# Patient Record
Sex: Female | Born: 1985
Health system: Southern US, Community
[De-identification: ages and names within clinical notes are randomized; demographics above are authoritative.]

## PROBLEM LIST (undated history)

## (undated) DIAGNOSIS — F419 Anxiety disorder, unspecified: Secondary | ICD-10-CM

## (undated) DIAGNOSIS — F329 Major depressive disorder, single episode, unspecified: Secondary | ICD-10-CM

## (undated) DIAGNOSIS — N880 Leukoplakia of cervix uteri: Secondary | ICD-10-CM

## (undated) DIAGNOSIS — F32A Depression, unspecified: Secondary | ICD-10-CM

## (undated) HISTORY — DX: Leukoplakia of cervix uteri: N88.0

## (undated) HISTORY — DX: Anxiety disorder, unspecified: F41.9

## (undated) HISTORY — PX: CHOLECYSTECTOMY: SHX55

## (undated) HISTORY — DX: Major depressive disorder, single episode, unspecified: F32.9

## (undated) HISTORY — DX: Depression, unspecified: F32.A

---

## 2015-05-07 DIAGNOSIS — Z9889 Other specified postprocedural states: Secondary | ICD-10-CM | POA: Insufficient documentation

## 2015-05-07 DIAGNOSIS — L905 Scar conditions and fibrosis of skin: Secondary | ICD-10-CM | POA: Insufficient documentation

## 2017-12-24 ENCOUNTER — Ambulatory Visit (INDEPENDENT_AMBULATORY_CARE_PROVIDER_SITE_OTHER): Payer: BLUE CROSS/BLUE SHIELD | Admitting: Physician Assistant

## 2017-12-24 ENCOUNTER — Encounter: Payer: Self-pay | Admitting: Physician Assistant

## 2017-12-24 ENCOUNTER — Ambulatory Visit (INDEPENDENT_AMBULATORY_CARE_PROVIDER_SITE_OTHER): Payer: BLUE CROSS/BLUE SHIELD | Admitting: Licensed Clinical Social Worker

## 2017-12-24 VITALS — BP 106/70 | HR 84 | Ht 64.0 in | Wt 144.0 lb

## 2017-12-24 DIAGNOSIS — F1221 Cannabis dependence, in remission: Secondary | ICD-10-CM | POA: Diagnosis not present

## 2017-12-24 DIAGNOSIS — F4310 Post-traumatic stress disorder, unspecified: Secondary | ICD-10-CM

## 2017-12-24 DIAGNOSIS — F331 Major depressive disorder, recurrent, moderate: Secondary | ICD-10-CM | POA: Diagnosis not present

## 2017-12-24 DIAGNOSIS — F410 Panic disorder [episodic paroxysmal anxiety] without agoraphobia: Secondary | ICD-10-CM | POA: Diagnosis not present

## 2017-12-24 DIAGNOSIS — F17290 Nicotine dependence, other tobacco product, uncomplicated: Secondary | ICD-10-CM | POA: Diagnosis not present

## 2017-12-24 DIAGNOSIS — Z7689 Persons encountering health services in other specified circumstances: Secondary | ICD-10-CM

## 2017-12-24 DIAGNOSIS — F418 Other specified anxiety disorders: Secondary | ICD-10-CM | POA: Diagnosis not present

## 2017-12-24 DIAGNOSIS — F411 Generalized anxiety disorder: Secondary | ICD-10-CM

## 2017-12-24 DIAGNOSIS — F341 Dysthymic disorder: Secondary | ICD-10-CM

## 2017-12-24 DIAGNOSIS — F1021 Alcohol dependence, in remission: Secondary | ICD-10-CM

## 2017-12-24 DIAGNOSIS — F17200 Nicotine dependence, unspecified, uncomplicated: Secondary | ICD-10-CM | POA: Insufficient documentation

## 2017-12-24 MED ORDER — ALPRAZOLAM 0.5 MG PO TABS: 0.5000 mg | ORAL_TABLET | Freq: Once | ORAL | 0 refills | Status: DC | PRN

## 2017-12-24 MED ORDER — ESCITALOPRAM OXALATE 5 MG PO TABS: ORAL_TABLET | ORAL | 0 refills | Status: DC
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
## 2017-12-24 NOTE — Progress Notes (Signed)
HPI:                                                                Paige Carter is a 32 y.o. female who [facility] today to establish care  Current concerns: anxiety and depression  Depression         This is a chronic problem.  The current episode started more than 1 year ago.   The onset quality is undetermined.   The problem occurs daily.  The problem has been gradually worsening since onset.  Associated symptoms include decreased concentration.  Associated symptoms include no suicidal ideas.     The symptoms are aggravated by work stress.  Past treatments include nothing.  Risk factors include alcohol intake, family history of mental illness, prior traumatic experience and stress.   Past medical history includes anxiety.     Pertinent negatives include no chronic illness, no recent psychiatric admission and no suicide attempts. Anxiety  Presents for initial visit. Onset was more than 5 years ago. The problem has been gradually worsening. Symptoms include decreased concentration, depressed mood, excessive worry, irritability, nervous/anxious behavior and panic. Patient reports no obsessions or suicidal ideas. Symptoms occur most days. The severity of symptoms is interfering with daily activities. The symptoms are aggravated by work stress.   Risk factors include family history and prior traumatic experience. There is no history of suicide attempts. Past treatments include nothing.     Reports years of uncontrolled anxiety and depression for many years. States she has been self-medicating with marijuana and alcohol. She has not had anything to drink in 3 weeks and has not used drugs in a week and a half. She was recently convicted of a DUI and has to pass bi-monthly drug tests. She states she is ready to learn coping skills that do not involve drugs or alcohol.  Denies history of self-harming behaviors or suicide attempt. She  does endorse very rare intrusive suicidal thoughts. Denies forming a plan or intention to act on these thoughts.   Depression screen PHQ 2/9 12/24/2017  Decreased Interest 1  Down, Depressed, Hopeless 1  PHQ - 2 Score 2  Altered sleeping 2  Tired, decreased energy 2  Change in appetite 2  Feeling bad or failure about yourself  2  Trouble concentrating 1  Moving slowly or fidgety/restless 1  Suicidal thoughts 1  PHQ-9 Score 13    GAD 7 : Generalized Anxiety Score 12/24/2017 12/24/2017  Nervous, Anxious, on Edge 3 3  Control/stop worrying 3 3  Worry too much - different things 3 3  Trouble relaxing 3 3  Restless 3 3  Easily annoyed or irritable 3 3  Afraid - awful might happen 1 1  Total GAD 7 Score 19 19      Past Medical History:  Diagnosis Date  . Anxiety   . Depression    Past Surgical History:  Procedure Laterality Date  . CHOLECYSTECTOMY     Social History   Tobacco Use  . Smoking status: Former Games developermoker  . Smokeless tobacco: Never Used  Substance Use Topics  . Alcohol use: Not Currently   family history includes Anxiety disorder in her mother and sister; Depression in her father; Heart attack  in her other; Hypertension in her other.    ROS: negative except as noted in the HPI  Medications: Current Outpatient Medications  Medication Sig Dispense Refill  . Probiotic Product (PROBIOTIC ADVANCED) CAPS Take by mouth.    . ALPRAZolam (XANAX) 0.5 MG tablet Take 1 tablet (0.5 mg total) by mouth once as needed for up to 1 dose for anxiety (panic attack). 20 tablet 0  . escitalopram (LEXAPRO) 5 MG tablet Take 0.5 tablets (2.5 mg total) by mouth at bedtime for 3 days, THEN 1 tablet (5 mg total) at bedtime for 4 days, THEN 2 tablets (10 mg total) at bedtime for 23 days. 52 tablet 0   No current facility-administered medications for this visit.    Not on File     Objective:  BP 106/70   Pulse 84   Ht 5\' 4"  (1.626 m)   Wt 144 lb (65.3 kg)   BMI 24.72 kg/m   Gen:  alert, not ill-appearing, no distress, appropriate for age HEENT: head normocephalic without obvious abnormality, conjunctiva and cornea clear, trachea midline Pulm: Normal work of breathing, normal phonation Neuro: alert and oriented x 3, no tremor MSK: extremities atraumatic, normal gait and station Skin: intact, no rashes on exposed skin, no jaundice, no cyanosis Psych: well-groomed, cooperative, good eye contact, depressed mood, affect mood-congruent, tearful, speech is articulate, and thought processes clear and goal-directed, good insight    No results found for this or any previous visit (from the past 72 hour(s)). No results found.    Assessment and Plan: 32 y.o. female with   Encounter to establish care  Other specified anxiety disorders - Plan: escitalopram (LEXAPRO) 5 MG tablet, Ambulatory referral to Psychiatry  Dysthymia - Plan: escitalopram (LEXAPRO) 5 MG tablet, Ambulatory referral to Psychiatry  Other tobacco product nicotine dependence, uncomplicated  PHQ9=13, no acute safety issues, GAD7=19 Starting Lexapro titration, 2.5 mg QHS x 3 days, 5 mg QHS x 4 days, 10 mg QHS. Counseled on adverse effects. Safety plan reviewed. She has used benzodiazepine prescription of friends for anxiety (not recreationally) in the past, but denies any history of abuse. Long discussion that we will do a low-dose and short supply to bridge her until her SSRI is at a therapeutic dose. Patient expressed understanding Referral placed to counseling Patient was also informed about the walk-in clinic on Wednesdays and encouraged to stop in today    Patient education and anticipatory guidance given Patient agrees with treatment plan Follow-up in 2 weeks or sooner as needed if symptoms worsen or fail to improve  Levonne Hubert PA-C

## 2017-12-24 NOTE — Patient Instructions (Signed)
Counseling: - [facility] all day - psychologytoday.com: search engine to locate local counselors - Family Services in your county offer counseling on a sliding scale (pay what you can afford) - [facility]: we can place a referral for you to see one of licensed counselors in [place], [place], or [place] - online counseling: BetterHelp and [facility] (not covered by insurance, but affordable self-pay rates)  Other resources: - [place].ateverydayhealth.com/depression/guide/resources/ - mindbodygreen.com - 7cupsoftea - https://malone.com/greatist.com/grow/resources-when-you-can-not-afford-therapy  Safety Plan: if having self-harm or suicidal thoughts Our Office [phone] [facility] Crisis Hotline 7577550043671 229 5516 National Suicide Hotline 1-800-SUICIDE If in immediate danger of harming yourself, go to the nearest emergency room or call 911

## 2018-01-07 ENCOUNTER — Encounter: Payer: Self-pay | Admitting: Physician Assistant

## 2018-01-07 ENCOUNTER — Ambulatory Visit (INDEPENDENT_AMBULATORY_CARE_PROVIDER_SITE_OTHER): Payer: BLUE CROSS/BLUE SHIELD | Admitting: Physician Assistant

## 2018-01-07 VITALS — BP 131/86 | HR 54 | Wt 142.0 lb

## 2018-01-07 DIAGNOSIS — Z87898 Personal history of other specified conditions: Secondary | ICD-10-CM | POA: Insufficient documentation

## 2018-01-07 DIAGNOSIS — F418 Other specified anxiety disorders: Secondary | ICD-10-CM | POA: Diagnosis not present

## 2018-01-07 DIAGNOSIS — F39 Unspecified mood [affective] disorder: Secondary | ICD-10-CM | POA: Diagnosis not present

## 2018-01-07 MED ORDER — ALPRAZOLAM 0.5 MG PO TABS: 0.5000 mg | ORAL_TABLET | Freq: Once | ORAL | 0 refills | Status: DC | PRN

## 2018-01-07 MED ORDER — LURASIDONE HCL 20 MG PO TABS: 20.0000 mg | ORAL_TABLET | Freq: Every day | ORAL | 1 refills | Status: DC

## 2018-01-07 MED ORDER — OLANZAPINE 5 MG PO TABS: 5.0000 mg | ORAL_TABLET | Freq: Every day | ORAL | 1 refills | Status: DC

## 2018-01-07 NOTE — Progress Notes (Signed)
HPI:                                                                Paige Carter is a 32 y.o. female who [facility] today for anxiety/depression follow-up  Pleasant 32 yo F presented 3 weeks ago with worsening anxiety, depression, and polysubstance misuse (marijuana and alcohol). She was started on Escitalopram self-titration up to 10 mg and low-dose Alprazolam 0.5 mg prn for panic attacks. Presents today for follow-up.  Reports when she takes Lexapro, "I feel like I'm on crack." She states she clenches her jaw, feels hyperactive/agitated and it takes her hours to wind down and fall asleep. She has tried dosing the medication earlier in the evening around 5 pm, but still has sleep difficulties. Reports it is taking hours to fall asleep, falls asleep atl 3am and usual wake time is 7a-8a, occasionally feels rested. She has been taking the Alprazolam most evenings due to the side effects of Lexapro. Has 4 tablets of Alprazolam left and has brought the bottle with her to this appointment.  Still feeling very irritable, states "I want to punch multiple people in the face." States the irritability makes her more anxious. Reports feeling less tearful and sad. Reports "I am not sad, but not happy." Reports she broke up with her boyfriend of 1 year and felt very emotionless about it and this is unusual for her.  She has not used any drugs or alcohol in over a month. History of DUI. Denies symptoms of mania. Mood disorder questionnaire is positive for hypomania.  [facility] 01/07/2018 12/24/2017  Decreased Interest 2 1  Down, Depressed, Hopeless 1 1  PHQ - 2 Score 3 2  Altered sleeping 3 2  Tired, decreased energy 3 2  Change in appetite 3 2  Feeling bad or failure about yourself  1 2  Trouble concentrating 1 1  Moving slowly or fidgety/restless 1 1  Suicidal thoughts 0 1  PHQ-9 Score 15 13    GAD 7 : Generalized  Anxiety Score 01/07/2018 12/24/2017 12/24/2017  Nervous, Anxious, on Edge 3 3 3   Control/stop worrying 1 3 3   Worry too much - different things 1 3 3   Trouble relaxing 2 3 3   Restless 1 3 3   Easily annoyed or irritable 3 3 3   Afraid - awful might happen 0 1 1  Total GAD 7 Score 11 19 19       Past Medical History:  Diagnosis Date  . Anxiety   . Depression    Past Surgical History:  Procedure Laterality Date  . CHOLECYSTECTOMY     Social History   Tobacco Use  . Smoking status: Former Games developermoker  . Smokeless tobacco: Never Used  Substance Use Topics  . Alcohol use: Not Currently   family history includes Anxiety disorder in her mother and sister; Depression in her father; Heart attack in her other; Hypertension in her other.    ROS: negative except as noted in the HPI  Medications: Current Outpatient Medications  Medication Sig Dispense Refill  . ALPRAZolam (XANAX) 0.5 MG tablet Take 1 tablet (0.5 mg total) by mouth once as needed for up to 1 dose for anxiety (panic attack). 20 tablet  0  . Probiotic Product (PROBIOTIC ADVANCED) CAPS Take by mouth.    . OLANZapine (ZYPREXA) 5 MG tablet Take 1 tablet (5 mg total) by mouth at bedtime. 30 tablet 1   No current facility-administered medications for this visit.    No Known Allergies     Objective:  BP 131/86   Pulse (!) 54   Wt 142 lb (64.4 kg)   BMI 24.37 kg/m  Gen:  alert, not ill-appearing, no distress, appropriate for age HEENT: head normocephalic without obvious abnormality, conjunctiva and cornea clear, trachea midline Pulm: Normal work of breathing, normal phonation, clear to auscultation bilaterally, no wheezes, rales or rhonchi CV: Normal rate, regular rhythm, s1 and s2 distinct, no murmurs, clicks or rubs  Neuro: alert and oriented x 3, no tremor MSK: extremities atraumatic, normal gait and station Skin: intact, no rashes on exposed skin, no jaundice, no cyanosis Psych: appearance casual, cooperative, good  eye contact, depressed mood, affect mood-congruent, speech is articulate, and thought processes clear and goal-directed, good insight    No results found for this or any previous visit (from the past 72 hour(s)). No results found.    Assessment and Plan: 32 y.o. female with   Mood disorder (HCC) - Plan: Ambulatory referral to Psychiatry, OLANZapine (ZYPREXA) 5 MG tablet, DISCONTINUED: lurasidone (LATUDA) 20 MG TABS tablet  Other specified anxiety disorders - Plan: ALPRAZolam (XANAX) 0.5 MG tablet, Ambulatory referral to Psychiatry  - I am concerned Lexapro is triggering hypomania. We are going to discontinue this. Her anxiety is responding to low-dose Alprazolam, but want to limit use due to her history of substance misuse - Mood Disorder Questionnaire positive today. Negative for hypersexuality, impulsive spending, decreased need for sleep - I am referring her to Psychiatry for evaluation for bipolar depression - No SI/HI, AH/VH, acute safety issues - discussed treatment option to include a mood stabilizer or await Psychiatry eval. She would like to start medication. Long discussion regarding risks/benefits, including increased suicidiality. Safety plan reviewed - start Olanazpine 5 mg QHS. Will continue Alprazolam 0.5 mg #20 until we can stabilize mood    Patient education and anticipatory guidance given Patient agrees with treatment plan Follow-up in 4 weeks or sooner as needed if symptoms worsen or fail to improve  Levonne Hubert PA-C

## 2018-01-07 NOTE — Patient Instructions (Addendum)
- stop Lexapro - start Latuda at bedtime - continue Xanax as needed for panic attacks - follow-up with Psychiatry   Safety Plan: if having self-harm or suicidal thoughts Our Office [phone] [facility] Crisis Hotline 50681330642177258177 National Suicide Hotline 1-800-SUICIDE If in immediate danger of harming yourself, go to the nearest emergency room or call 911      Living With Anxiety After being diagnosed with an anxiety disorder, you may be relieved to know why you have felt or behaved a certain way. It is natural to also feel overwhelmed about the treatment ahead and what it will mean for your life. With care and support, you can manage this condition and recover from it. How to cope with anxiety Dealing with stress Stress is your body's reaction to life changes and events, both good and bad. Stress can last just a few hours or it can be ongoing. Stress can play a major role in anxiety, so it is important to learn both how to cope with stress and how to think about it differently. Talk with your health care provider or a counselor to learn more about stress reduction. He or she may suggest some stress reduction techniques, such as:  Music therapy. This can include creating or listening to music that you enjoy and that inspires you.  Mindfulness-based meditation. This involves being aware of your normal breaths, rather than trying to control your breathing. It can be done while sitting or walking.  Centering prayer. This is a kind of meditation that involves focusing on a word, phrase, or sacred image that is meaningful to you and that brings you peace.  Deep breathing. To do this, expand your stomach and inhale slowly through your nose. Hold your breath for 3-5 seconds. Then exhale slowly, allowing your stomach muscles to relax.  Self-talk. This is a skill where you identify thought patterns that lead to anxiety reactions and correct those thoughts.  Muscle relaxation. This involves  tensing muscles then relaxing them.  Choose a stress reduction technique that fits your lifestyle and personality. Stress reduction techniques take time and practice. Set aside 5-15 minutes a day to do them. Therapists can offer training in these techniques. The training may be covered by some insurance plans. Other things you can do to manage stress include:  Keeping a stress diary. This can help you learn what triggers your stress and ways to control your response.  Thinking about how you respond to certain situations. You may not be able to control everything, but you can control your reaction.  Making time for activities that help you relax, and not feeling guilty about spending your time in this way.  Therapy combined with coping and stress-reduction skills provides the best chance for successful treatment. Medicines Medicines can help ease symptoms. Medicines for anxiety include:  Anti-anxiety drugs.  Antidepressants.  Beta-blockers.  Medicines may be used as the main treatment for anxiety disorder, along with therapy, or if other treatments are not working. Medicines should be prescribed by a health care provider. Relationships Relationships can play a big part in helping you recover. Try to spend more time connecting with trusted friends and family members. Consider going to couples counseling, taking family education classes, or going to family therapy. Therapy can help you and others better understand the condition. How to recognize changes in your condition Everyone has a different response to treatment for anxiety. Recovery from anxiety happens when symptoms decrease and stop interfering with your daily activities at home or work.  This may mean that you will start to:  Have better concentration and focus.  Sleep better.  Be less irritable.  Have more energy.  Have improved memory.  It is important to recognize when your condition is getting worse. Contact your health  care provider if your symptoms interfere with home or work and you do not feel like your condition is improving. Where to find help and support: You can get help and support from these sources:  Self-help groups.  Online and [employer].  A trusted spiritual leader.  Couples counseling.  Family education classes.  Family therapy.  Follow these instructions at home:  Eat a healthy diet that includes plenty of vegetables, fruits, whole grains, low-fat dairy products, and lean protein. Do not eat a lot of foods that are high in solid fats, added sugars, or salt.  Exercise. Most adults should do the following: ? Exercise for at least 150 minutes each week. The exercise should increase your heart rate and make you sweat (moderate-intensity exercise). ? Strengthening exercises at least twice a week.  Cut down on caffeine, tobacco, alcohol, and other potentially harmful substances.  Get the right amount and quality of sleep. Most adults need 7-9 hours of sleep each night.  Make choices that simplify your life.  Take over-the-counter and prescription medicines only as told by your health care provider.  Avoid caffeine, alcohol, and certain over-the-counter cold medicines. These may make you feel worse. Ask your pharmacist which medicines to avoid.  Keep all follow-up visits as told by your health care provider. This is important. Questions to ask your health care provider  Would I benefit from therapy?  How often should I follow up with a health care provider?  How long do I need to take medicine?  Are there any long-term side effects of my medicine?  Are there any alternatives to taking medicine? Contact a health care provider if:  You have a hard time staying focused or finishing daily tasks.  You spend many hours a day feeling worried about everyday life.  You become exhausted by worry.  You start to have headaches, feel tense, or have nausea.  You  urinate more than normal.  You have diarrhea. Get help right away if:  You have a racing heart and shortness of breath.  You have thoughts of hurting yourself or others. If you ever feel like you may hurt yourself or others, or have thoughts about taking your own life, get help right away. You can go to your nearest emergency department or call:  Your local emergency services (911 in the [place]  A suicide crisis helpline, such as the National Suicide Prevention Lifeline at 220 445 0810. This is open 24-hours a day.  Summary  Taking steps to deal with stress can help calm you.  Medicines cannot cure anxiety disorders, but they can help ease symptoms.  Family, friends, and partners can play a big part in helping you recover from an anxiety disorder. This information is not intended to replace advice given to you by your health care provider. Make sure you discuss any questions you have with your health care provider. Document Released: 05/28/2016 Document Revised: 05/28/2016 Document Reviewed: 05/28/2016 Elsevier Interactive Patient Education  [employer].

## 2018-01-14 ENCOUNTER — Telehealth: Payer: Self-pay | Admitting: Physician Assistant

## 2018-01-14 ENCOUNTER — Ambulatory Visit (INDEPENDENT_AMBULATORY_CARE_PROVIDER_SITE_OTHER): Payer: BLUE CROSS/BLUE SHIELD | Admitting: Physician Assistant

## 2018-01-14 ENCOUNTER — Encounter: Payer: Self-pay | Admitting: Physician Assistant

## 2018-01-14 ENCOUNTER — Other Ambulatory Visit (HOSPITAL_COMMUNITY)
Admission: RE | Admit: 2018-01-14 | Discharge: 2018-01-14 | Disposition: A | Discharge status: Home / Self Care | Payer: BLUE CROSS/BLUE SHIELD | Source: Ambulatory Visit | Attending: Physician Assistant | Admitting: Physician Assistant

## 2018-01-14 VITALS — BP 112/74 | HR 63 | Resp 16 | Wt 145.0 lb

## 2018-01-14 DIAGNOSIS — Z113 Encounter for screening for infections with a predominantly sexual mode of transmission: Secondary | ICD-10-CM

## 2018-01-14 DIAGNOSIS — Z124 Encounter for screening for malignant neoplasm of cervix: Secondary | ICD-10-CM | POA: Diagnosis present

## 2018-01-14 DIAGNOSIS — Z30011 Encounter for initial prescription of contraceptive pills: Secondary | ICD-10-CM | POA: Diagnosis not present

## 2018-01-14 LAB — POCT URINE PREGNANCY: Preg Test, Ur: NEGATIVE

## 2018-01-14 MED ORDER — NORETHIN-ETH ESTRAD-FE BIPHAS 1 MG-10 MCG / 10 MCG PO TABS: 1.0000 | ORAL_TABLET | Freq: Every day | ORAL | 4 refills | Status: DC

## 2018-01-14 NOTE — Progress Notes (Signed)
HPI:                                                                Paige Carter is a 32 y.o. female who presents to [facility] today for Pap smear only  Current Concerns include:   Would like to swtich OCP. Currently on COC, but did well on Loestrin in the past. She would like the lowest dose of estrogen possible.   GYN/Sexual Health  Obstetrics: G1P1  Menstrual status: having periods  LMP: 01/07/18  Menses: regular  Last pap smear: age 72  History of abnormal pap smears:   Sexually active: yes  Current contraception: OCP  History of STI: no  Declines screening  Health Maintenance Health Maintenance  Topic Date Due  . HIV Screening  06/28/1999  . PAP SMEAR  06/27/2005  . INFLUENZA VACCINE  01/15/2018  . TETANUS/TDAP  10/08/2024    Past Medical History:  Diagnosis Date  . Anxiety   . Depression    Past Surgical History:  Procedure Laterality Date  . CHOLECYSTECTOMY     Social History   Tobacco Use  . Smoking status: Former Games developer  . Smokeless tobacco: Never Used  Substance Use Topics  . Alcohol use: Not Currently   family history includes Anxiety disorder in her mother and sister; Depression in her father; Heart attack in her other; Hypertension in her other.  ROS: negative except as noted in the HPI  Medications: Current Outpatient Medications  Medication Sig Dispense Refill  . ALPRAZolam (XANAX) 0.5 MG tablet Take 1 tablet (0.5 mg total) by mouth once as needed for up to 1 dose for anxiety (panic attack). 20 tablet 0  . Norethindrone-Ethinyl Estradiol-Fe Biphas (LO LOESTRIN FE) 1 MG-10 MCG / 10 MCG tablet Take 1 tablet by mouth daily. 3 Package 4  . Probiotic Product (PROBIOTIC ADVANCED) CAPS Take by mouth.     No current facility-administered medications for this visit.    No Known Allergies     Objective:  BP 112/74   Pulse 63   Resp 16   Wt 145 lb (65.8 kg)   LMP 01/07/2018  (Approximate)   BMI 24.89 kg/m  GU: vulva without rashes or lesions, normal introitus and urethral meatus, vaginal mucosa without erythema, normal discharge, cervix non-friable with white flat lesions, unable to visualize the cervical os  A chaperone was used for the GU portion of the exam, Olivia Mackie, CMA.    Results for orders placed or performed in visit on 01/14/18 (from the past 72 hour(s))  POCT urine pregnancy     Status: Normal   Collection Time: 01/14/18 11:16 AM  Result Value Ref Range   Preg Test, Ur Negative Negative   No results found.    Assessment and Plan: 32 y.o. female with   Encounter for Pap smear of cervix with HPV DNA cotesting - Plan: Cytology - PAP  Encounter for BCP (birth control pills) initial prescription - Plan: Norethindrone-Ethinyl Estradiol-Fe Biphas (LO LOESTRIN FE) 1 MG-10 MCG / 10 MCG tablet, POCT urine pregnancy  Routine screening for STI (sexually transmitted infection) - Plan: C. trachomatis/N. gonorrhoeae RNA    Patient education and anticipatory guidance given Patient agrees with treatment plan Follow-up based on  Pap results or sooner as needed  Levonne Hubertharley E. Cayleen Benjamin PA-C

## 2018-01-14 NOTE — Telephone Encounter (Signed)
Received a message from pharmcy that Lo-Loestrin requires PA. Awaiting determination.

## 2018-01-14 NOTE — Patient Instructions (Signed)
Preventing Cervical Cancer Cervical cancer is cancer that grows on the cervix. The cervix is at the bottom of the uterus. It connects the uterus to the vagina. The uterus is where a baby develops during pregnancy. Cancer occurs when cells become abnormal and start to grow out of control. Cervical cancer grows slowly and may not cause any symptoms at first. Over time, the cancer can grow deep into the cervix tissue and spread to other areas. If it is found early, cervical cancer can be treated effectively. You can also take steps to prevent this type of cancer. Most cases of cervical cancer are caused by an STI (sexually transmitted infection) called human papillomavirus (HPV). One way to reduce your risk of cervical cancer is to avoid infection with the HPV virus. You can do this by practicing safe sex and by getting the HPV vaccine. Getting regular Pap tests is also important because this can help identify changes in cells that could lead to cancer. Your chances of getting this disease can also be reduced by making certain lifestyle changes. How can I protect myself from cervical cancer? Preventing HPV infection  Ask your health care provider about getting the HPV vaccine. If you are 26 years old or younger, you may need to get this vaccine, which is given in three doses over 6 months. This vaccine protects against the types of HPV that could cause cancer.  Limit the number of people you have sex with. Also avoid having sex with people who have had many sex partners.  Use a latex condom during sex. Getting Pap tests  Get Pap tests regularly, starting at age 21. Talk with your health care provider about how often you need these tests. ? Most women who are 21?33 years of age should have a Pap test every 3 years. ? Most women who are 30?33 years of age should have a Pap test in combination with an HPV test every 5 years. ? Women with a higher risk of cervical cancer, such as those with a weakened  immune system or those who have been exposed to the drug diethylstilbestrol (DES), may need more frequent testing. Making other lifestyle changes  Do not use any products that contain nicotine or tobacco, such as cigarettes and e-cigarettes. If you need help quitting, ask your health care provider.  Eat at least 5 servings of fruits and vegetables every day.  Lose weight if you are overweight. Why are these changes important?  These changes and screening tests are designed to address the factors that are known to increase the risk of cervical cancer. Taking these steps is the best way to reduce your risk.  Having regular Pap tests will help identify changes in cells that could lead to cancer. Steps can then be taken to prevent cancer from developing.  These changes will also help find cervical cancer early. This type of cancer can be treated effectively if it is found early. It can be more dangerous and difficult to treat if cancer has grown deep into your cervix or has spread.  In addition to making you less likely to get cervical cancer, these changes will also provide other health benefits, such as the following: ? Practicing safe sex is important for preventing STIs and unplanned pregnancies. ? Avoiding tobacco can reduce your risk for other cancers and health issues. ? Eating a healthy diet and maintaining a healthy weight are good for your overall health. What can happen if changes are not made? In the   early stages, cervical cancer might not have any symptoms. It can take many years for the cancer to grow and get deep into the cervix tissue. This may be happening without you knowing about it. If you develop any symptoms, such as pelvic pain or unusual discharge or bleeding from your vagina, you should see your health care provider right away. If cervical cancer is not found early, you might need treatments such as radiation, chemotherapy, or surgery. In some cases, surgery may mean that  you will not be able to get pregnant or carry a pregnancy to term. Where to find support: Talk with your health care provider, school nurse, or local health department for guidance about screening and vaccination. Some children and teens may be able to get the HPV vaccine free of charge through the U.[place] government's Vaccines for Children (VFC) program. Other places that provide vaccinations include:  Public health clinics. Check with your local health department.  Federally Qualified Health Centers, where you would pay only what you can afford. To find one near you, check this website: www.fqhc.org/find-an-fqhc/  Rural Health Clinics. These are part of a program for Medicare and Medicaid patients who live in rural areas.  The National Breast and Cervical Cancer Early Detection Program also provides breast and cervical cancer screenings and diagnostic services to low-income, uninsured, and underinsured women. Cervical cancer can be passed down through families. Talk with your health care provider or genetic counselor to learn more about genetic testing for cancer. Where to find more information: Learn more about cervical cancer from:  American College of Gynecology: www.acog.org/Patients/FAQs/Cervical-Cancer  American Cancer Society: www.cancer.org/cancer/cervicalcancer/  [place].S. Centers for Disease Control and Prevention: www.cdc.gov/cancer/cervical/  Summary  Talk with your health care provider about getting the HPV vaccine.  Be sure to get regular Pap tests as recommended by your health care provider.  See your health care provider right away if you have any pelvic pain or unusual discharge or bleeding from your vagina. This information is not intended to replace advice given to you by your health care provider. Make sure you discuss any questions you have with your health care provider. Document Released: 06/18/2015 Document Revised: 01/30/2016 Document Reviewed: 01/30/2016 Elsevier  Interactive Patient Education  2018 Elsevier Inc.  

## 2018-01-15 LAB — C. TRACHOMATIS/N. GONORRHOEAE RNA
C. trachomatis RNA, TMA: NOT DETECTED
N. gonorrhoeae RNA, TMA: NOT DETECTED

## 2018-01-16 ENCOUNTER — Telehealth: Payer: Self-pay | Admitting: Physician Assistant

## 2018-01-16 MED ORDER — QUETIAPINE FUMARATE 25 MG PO TABS: 25.0000 mg | ORAL_TABLET | Freq: Every day | ORAL | 0 refills | Status: DC

## 2018-01-16 MED ORDER — CLONAZEPAM 1 MG PO TABS
1.0000 mg | ORAL_TABLET | Freq: Two times a day (BID) | ORAL | 0 refills | Status: DC
Start: 2018-01-16 — End: 2018-02-12

## 2018-01-16 NOTE — Telephone Encounter (Signed)
Discussion with patient.  She is not doing quite well.  She is having to take Xanax frequently.  She did not tolerate olanzapine.  She is having trouble sleeping.  There is concern for bipolar disorder and hypomania.  Plan to switch to very low-dose Seroquel and titrate up from 25 mg up to potentially 100 mg over the next few days. Additionally switch to Klonopin.  I think the longer acting schedule Klonopin will work better than intermittent Xanax.  Ultimately I think it would be great if we can get Paulina in with psychiatry ASAP.  She is currently able to work but I am worried that she will lose her job or have major life problems as a result of the severity of her illness.  She had to leave work yesterday and I am quite concerned that her mental health is going to worsen and threaten her livelihood.  Additionally will contact [facility] and let them know that I am aware and intend for the patient to discontinue Xanax and start Klonopin.

## 2018-01-16 NOTE — Telephone Encounter (Signed)
[facility] that lo loestrin was approved from 01/14/2018 through 01/12/2021. Pharmacy notified and forms sent to scan.-HSM

## 2018-01-16 NOTE — Telephone Encounter (Signed)
Pt called clinic tearful today stating she cannot get into the Psych she was referred to until October. We are going to work on getting in her a different office sooner.   In the interim, Pt reports she is having to take the xanax more frequently because she is having about 3 panic attacks/day since she is not on a maintenance Rx. She was taken off Lexapro because it was making her very angry and irritable. She tried to get Latuda and Olanzapine but her insurance wouldn't cover them.   Questions if there is another maintenance Rx she can try. We found out that [facility] can get her scheduled in 2-3 weeks. Routing to covering Provider today for review/recommendation.

## 2018-01-16 NOTE — Telephone Encounter (Signed)
Left VM update with Pt of new referral location and their phone number for scheduling.

## 2018-01-16 NOTE — Telephone Encounter (Signed)
Sent MyChart message with contact info for referral as well.

## 2018-01-16 NOTE — Telephone Encounter (Signed)
Pharmacy advised  

## 2018-01-17 LAB — CYTOLOGY - PAP
Diagnosis: NEGATIVE
HPV: NOT DETECTED

## 2018-01-18 ENCOUNTER — Encounter: Payer: Self-pay | Admitting: Physician Assistant

## 2018-01-18 DIAGNOSIS — N880 Leukoplakia of cervix uteri: Secondary | ICD-10-CM

## 2018-01-18 HISTORY — DX: Leukoplakia of cervix uteri: N88.0

## 2018-01-19 ENCOUNTER — Ambulatory Visit (INDEPENDENT_AMBULATORY_CARE_PROVIDER_SITE_OTHER): Payer: BLUE CROSS/BLUE SHIELD | Admitting: Licensed Clinical Social Worker

## 2018-01-19 DIAGNOSIS — F1221 Cannabis dependence, in remission: Secondary | ICD-10-CM

## 2018-01-19 DIAGNOSIS — F410 Panic disorder [episodic paroxysmal anxiety] without agoraphobia: Secondary | ICD-10-CM

## 2018-01-19 DIAGNOSIS — F331 Major depressive disorder, recurrent, moderate: Secondary | ICD-10-CM | POA: Diagnosis not present

## 2018-01-19 DIAGNOSIS — F1021 Alcohol dependence, in remission: Secondary | ICD-10-CM

## 2018-01-19 DIAGNOSIS — F4310 Post-traumatic stress disorder, unspecified: Secondary | ICD-10-CM

## 2018-01-19 DIAGNOSIS — F411 Generalized anxiety disorder: Secondary | ICD-10-CM | POA: Diagnosis not present

## 2018-01-19 NOTE — Progress Notes (Addendum)
THERAPIST PROGRESS NOTE  Session Time: 10:02 AM to 10:58 AM  Participation Level: Active  Behavioral Response: CasualAlertDysphoric and tearful  Type of Therapy: Individual Therapy  Treatment Goals addressed: decrease in anxiety, coping for panic, decrease in depression, decrease in trauma symptoms, coping  Interventions: CBT, Solution Focused, Strength-based, Supportive and Other: trauma focused interventions, psychoeducation on d/a treatment and recorvery  Summary: Cataleah D Spieler is a 32 y.o. female who presents with panic disorder, generalized anxiety disorder, major depressive disorder, recurrent, moderate PTSD, cannabis use disorder, severe in early remission and alcohol use disorder, severe, in early remission  Suicidal/Homicidal: No  Therapist Response: Patient updated therapist about issues with medications, medications changes. Scheduled to go to [facility] to psychiatrist in two weeks. Believe that she has diagnosis of bipolar, BPD. Was put on Lexapro for two weeks, made aggressive, unhappy, miserable, emotionless, broke up with boyfriend who she has been happy with when on med. Stopped this medication, put on Zyprexa one day at night, still insomnia, couldn't walk straight, see straight until 5 PM, won't stay on the med because she has to function. Was having panic attacks more, 3 a day, was put on Xanax and continue to have panic although less severe, given 0.5 mg tabs and 20 pills in 2 weeks they were gone and this concerned her. Was put on Klonopin 1 mg in the morning and night and has really helped panic. Patient relates aware meds help in managing symptoms, but also has been using drugs and alcohol and suppressing things and needs to be able to develop coping strategies not to suppress, deal with things, and to deal separate from meds. Relates dad just went into the hospital on Saturday,attempted to use coping skills including deep breathing and distracting, didn't  take away the stress but took away physical pain, not hyperventilating. Therapist pointed out that applying the skills and being successful will help strengthen her skills. Patient relates, mind instantly goes into panic, different thoughts all at once. Therapist encouraged patient to slow reactivity time down that helps in making choices that helps choose more helpful coping skills. Discussed becoming more aware of automatic thoughts that are negative reframe to positive. Patient shares did not have panic and anxiety  until she was alone, suppresses thoughts until she was alone. Therapist provided education in explaining that increased anxiety and panic as overactive amygdala and that relaxation strategies will help to deactivate and decrease anxiety. Relates that listens to music when gets up, after work gets work clothes off and reads a book, at work no time to do relaxation, boss creates chaos, tries to decompress and relax after work but thinks about the day, can't stop thoughts, starts increasing panic, explains that everything she pushes down during the day, comes up, manage it but not easy. Reviewed with patient how reviewing the day turn into ruminating thoughts and not helpful problem solving, and distraction as helpful. Patient explains not just everyday life but things suppressing since 14. Has racing thoughts at night and Seroquel helps her to sleep, but can't wake up cloudy, was sitting up all night with thoughts going. Patient relates obsess because has to be perfect, if something goes wrong then obssss what she did wrong. Recognizes this relates to when young and couldn't do anything right, her attempts to be perfect so she doesn't ever have to hear that again. Her best discussed never doing anything right as a core belief, that needs to be challenged. Patient relates writing a journal  but curse words, yelling at herself and other people come out. Therapist discussed early recovery can be releasing  of emotions that have been suppressed, healthy to find ways to release emotions, recognizes a lot of anger being released. Recognizes anger because of childhood being basis of this, self-doubt, made to be a certain way but is carried to adult life. Being an obsessive person, every daily life would make a mistake, relates reviewing each time 1 makes a mistake, reliving when she made a mistake over something insignificant and stepdad pounding her head against the wall that she would learn not to make mistake, reviewed with mom says she doesn't remember that makes it worse, relates why she doesn't want to have feelings about this, patient expressed feeling that it's unfair, relives the moment every times she feels she has made a mistake, only she can get through it is pushing, figuratively kicking her way through the moment. Reviewed session and patient relates has to work on challenging perfectionism, having to work on trauma symptoms so she is not reliving the past and the present.  Therapist assessed patient current functioning per report. Provided psychoeducation on panic that involves both physiological symptoms of fighting fight as well as negative self talk, encouraged patient to attention to automatic thoughts and when she notices to reframe to positive one so she does not feed into spiraled panic. Discussed overall relaxation plan as approached to anxiety as it helps to calm body, helps overall level of anxiety someone is not as quickly activated as well as helping to learn relaxation skills and mom at that will help with escalation of anxiety. Identified relaxation strategies for patient. Discussed rumination asineffectual problem solving to recognize when she is engaging in this to help her refocus and not engaged in activity that is not helpful as well as distraction is helpful. Discussed reoccurring flashback and discuss cognitive strategy of reminding herself that memories from the past, that she's not  in the past that is not happening now to help deal with trigger. Discussed perfectionist standards that feed into ruminating thoughts, discussed challenging perfectionists thinking as a unrealistic thought, reframing and changing message to oneself that is more accurate and realistic such as a person with capabilities, a mix of skills and at a level of development, look at self from a growth perspective. Discussed challenging core belief developed in childhood that she is not good at anything, recognizing she defines worth and has value and needing to work strengthening this more accurate core belief about self. Provided strength based and supportive intervention.help patient with exploration and processing of emotions to help her also and gaining insight to early stage of recovery involves releasing of emotions that were suppressed and coming to learn about self that was loss and ones addiction  Plan: Return again in 2 weeks.2.therapist work with patient on challenging perfectionistic thinking, strategies to manage flashbacks, emotional regulation skills, begin work on processes of trauma. 3. Patient work on challenging perfectionists thinking, challenging ruminative thoughts which is ineffectual problem solving, beginning to use coping strategies for panic  Diagnosis: Axis I:  panic disorder, generalized anxiety disorder, major depressive disorder, recurrent, moderate PTSD, cannabis use disorder, severe, in early remission, alcohol use disorder, severe, in early remission     Axis II: no diagnosis    Coolidge BreezeMary Kelda Azad, LCSW 01/19/2018

## 2018-02-02 ENCOUNTER — Ambulatory Visit (HOSPITAL_COMMUNITY): Payer: BLUE CROSS/BLUE SHIELD | Admitting: Licensed Clinical Social Worker

## 2018-02-04 ENCOUNTER — Ambulatory Visit: Payer: Self-pay | Admitting: Physician Assistant

## 2018-02-10 ENCOUNTER — Ambulatory Visit: Payer: BLUE CROSS/BLUE SHIELD | Admitting: Physician Assistant

## 2018-02-12 ENCOUNTER — Ambulatory Visit (INDEPENDENT_AMBULATORY_CARE_PROVIDER_SITE_OTHER): Payer: BLUE CROSS/BLUE SHIELD | Admitting: Physician Assistant

## 2018-02-12 ENCOUNTER — Encounter: Payer: Self-pay | Admitting: Physician Assistant

## 2018-02-12 VITALS — BP 117/81 | HR 79 | Wt 145.0 lb

## 2018-02-12 DIAGNOSIS — F410 Panic disorder [episodic paroxysmal anxiety] without agoraphobia: Secondary | ICD-10-CM | POA: Diagnosis not present

## 2018-02-12 DIAGNOSIS — F39 Unspecified mood [affective] disorder: Secondary | ICD-10-CM | POA: Diagnosis not present

## 2018-02-12 MED ORDER — QUETIAPINE FUMARATE 25 MG PO TABS: 25.0000 mg | ORAL_TABLET | Freq: Every day | ORAL | 0 refills | Status: DC

## 2018-02-12 MED ORDER — CLONAZEPAM 0.5 MG PO TABS: 0.5000 mg | ORAL_TABLET | Freq: Two times a day (BID) | ORAL | 0 refills | Status: DC

## 2018-02-12 NOTE — Progress Notes (Signed)
HPI:                                                                Paige Carter is a 32 y.o. female who presents to [facility]: Primary Care Sports Medicine today for mood disorder follow-up  Since last office visit: - she was tapered off of Lexapro due to increased agitation and started on Zyprexa 5 mg. She was intolerant to the Zyprexa, "way too out of it and cloudy" and self-discontinued this after one dose - she had a positive mood disorder screening questionairre and was referred to Psychiatry. she has an appointment with a [facility] on 02/18/18 - she had one office visit with counselor downstairs Coolidge Breeze), but did not feel like she clicked with this counselor - she was switched to Quetiapine 25-50 mg at bedtime and Clonazepam 1 mg twice a day by provider on call Clementeen Graham, MD). She is currently taking 25 mg. This is working well for sleep. Sleeping 5-6 hours without nighttime awakenings. Recently has noticed that she is waking up prematurely in the morning, but is able to fall back asleep within 10-15 minutes -"Still have my episodes sometimes," which she describes as "overthinking, over-reacting which leads to panic, and I can't stop it once that happens." Would like to get through a day without having a panic attack.  Office visit 01/07/18 Pleasant 32 yo F presented 3 weeks ago with worsening anxiety, depression, and polysubstance misuse (marijuana and alcohol). She was started on Escitalopram self-titration up to 10 mg and low-dose Alprazolam 0.5 mg prn for panic attacks. Presents today for follow-up.  Reports when she takes Lexapro, "I feel like I'm on crack." She states she clenches her jaw, feels hyperactive/agitated and it takes her hours to wind down and fall asleep. She has tried dosing the medication earlier in the evening around 5 pm, but still has sleep difficulties. Reports it is taking hours to fall asleep, falls asleep atl  3am and usual wake time is 7a-8a, occasionally feels rested. She has been taking the Alprazolam most evenings due to the side effects of Lexapro. Has 4 tablets of Alprazolam left and has brought the bottle with her to this appointment.  Still feeling very irritable, states "I want to punch multiple people in the face." States the irritability makes her more anxious. Reports feeling less tearful and sad. Reports "I am not sad, but not happy." Reports she broke up with her boyfriend of 1 year and felt very emotionless about it and this is unusual for her.  She has not used any drugs or alcohol in over a month. History of DUI. Denies symptoms of mania. Mood disorder questionnaire is positive for hypomania. Depression screen [facility] 2/9 02/12/2018 01/07/2018 12/24/2017  Decreased Interest 2 2 1   Down, Depressed, Hopeless 2 1 1   PHQ - 2 Score 4 3 2   Altered sleeping 1 3 2   Tired, decreased energy 2 3 2   Change in appetite 2 3 2   Feeling bad or failure about yourself  3 1 2   Trouble concentrating 2 1 1   Moving slowly or fidgety/restless 1 1 1   Suicidal thoughts 1 0 1  PHQ-9 Score 16 15 13     GAD 7 : Generalized Anxiety Score  02/12/2018 01/07/2018 12/24/2017 12/24/2017  Nervous, Anxious, on Edge 3 3 3 3   Control/stop worrying 3 1 3 3   Worry too much - different things 3 1 3 3   Trouble relaxing 3 2 3 3   Restless 3 1 3 3   Easily annoyed or irritable 3 3 3 3   Afraid - awful might happen 0 0 1 1  Total GAD 7 Score 18 11 19 19       Past Medical History:  Diagnosis Date  . Anxiety   . Depression   . Hyperkeratosis of cervix 01/18/2018   Past Surgical History:  Procedure Laterality Date  . CHOLECYSTECTOMY     Social History   Tobacco Use  . Smoking status: Former Games developermoker  . Smokeless tobacco: Never Used  Substance Use Topics  . Alcohol use: Not Currently   family history includes Anxiety disorder in her mother and sister; Depression in her father; Heart attack in her other; Hypertension in  her other.    ROS: negative except as noted in the HPI  Medications: Current Outpatient Medications  Medication Sig Dispense Refill  . clonazePAM (KLONOPIN) 0.5 MG tablet Take 1 tablet (0.5 mg total) by mouth 2 (two) times daily. 20 tablet 0  . Norethindrone-Ethinyl Estradiol-Fe Biphas (LO LOESTRIN FE) 1 MG-10 MCG / 10 MCG tablet Take 1 tablet by mouth daily. 3 Package 4  . Probiotic Product (PROBIOTIC ADVANCED) CAPS Take by mouth.    . QUEtiapine (SEROQUEL) 25 MG tablet Take 1-2 tablets (25-50 mg total) by mouth at bedtime. 30 tablet 0   No current facility-administered medications for this visit.    No Known Allergies     Objective:  BP 117/81   Pulse 79   Wt 145 lb (65.8 kg)   BMI 24.89 kg/m  Gen:  alert, not ill-appearing, no distress, appropriate for age HEENT: head normocephalic without obvious abnormality, conjunctiva and cornea clear, trachea midline Pulm: Normal work of breathing, normal phonation, clear to auscultation bilaterally, no wheezes, rales or rhonchi CV: Normal rate, regular rhythm, s1 and s2 distinct, no murmurs, clicks or rubs  Neuro: alert and oriented x 3, no tremor MSK: extremities atraumatic, normal gait and station Skin: intact, no rashes on exposed skin, no jaundice, no cyanosis Psych: casual appearance, cooperative, good eye contact, depressed mood, affect mood-congruent, speech is articulate, and thought processes clear and goal-directed, good insight    No results found for this or any previous visit (from the past 72 hour(s)). No results found.    Assessment and Plan: 32 y.o. female with   .Diagnoses and all orders for this visit:  Mood disorder (HCC) -     QUEtiapine (SEROQUEL) 25 MG tablet; Take 1-2 tablets (25-50 mg total) by mouth at bedtime.  Panic disorder -     clonazePAM (KLONOPIN) 0.5 MG tablet; Take 1 tablet (0.5 mg total) by mouth 2 (two) times daily.   Plan for Psychiatry to evaluate and take over medication management  next month. In the meantime, I am refilling Seroquel and reducing Clonazepam to 0.5 mg bid. We discussed that Psychiatry may not wish to continue benzodiazepine, especially with history of substance use disorder, and that use of this medication is a bridge until she is on a mood stabilizer/anti-anxiety controller medication. Patient is not currently misusing prescription medication or drugs/alcohol. Patient verbalized understanding. Encouraged her to establish with one of the counselors at [facility] for CBT  Patient education and anticipatory guidance given Patient agrees with treatment plan Follow-up as  needed if symptoms worsen or fail to improve  Darlyne Russian PA-C

## 2018-04-06 ENCOUNTER — Ambulatory Visit (HOSPITAL_COMMUNITY): Payer: Self-pay | Admitting: Psychiatry

## 2019-01-09 ENCOUNTER — Encounter: Payer: Self-pay | Admitting: Emergency Medicine

## 2019-01-09 ENCOUNTER — Emergency Department
Admission: EM | Admit: 2019-01-09 | Discharge: 2019-01-09 | Disposition: A | Discharge status: Home / Self Care | Payer: BLUE CROSS/BLUE SHIELD | Source: Home / Self Care

## 2019-01-09 ENCOUNTER — Other Ambulatory Visit: Payer: Self-pay

## 2019-01-09 ENCOUNTER — Emergency Department (INDEPENDENT_AMBULATORY_CARE_PROVIDER_SITE_OTHER): Payer: BC Managed Care – PPO

## 2019-01-09 DIAGNOSIS — S52124A Nondisplaced fracture of head of right radius, initial encounter for closed fracture: Secondary | ICD-10-CM

## 2019-01-09 DIAGNOSIS — W19XXXA Unspecified fall, initial encounter: Secondary | ICD-10-CM

## 2019-01-09 DIAGNOSIS — S59901A Unspecified injury of right elbow, initial encounter: Secondary | ICD-10-CM | POA: Diagnosis not present

## 2019-01-09 DIAGNOSIS — M25511 Pain in right shoulder: Secondary | ICD-10-CM

## 2019-01-09 DIAGNOSIS — W010XXA Fall on same level from slipping, tripping and stumbling without subsequent striking against object, initial encounter: Secondary | ICD-10-CM

## 2019-01-09 NOTE — ED Triage Notes (Signed)
Patient fell last night at work, fell on her back, now having right arm/elbow pain.  Unable to move her arm.

## 2019-01-09 NOTE — ED Provider Notes (Signed)
[facility]    CSN: [id number] Arrival date & time: 01/09/19  1307     History   Chief Complaint Chief Complaint  Patient presents with  . Arm Pain    HPI Paige Carter is a 33 y.o. female.   HPI Paige Carter is a 33 y.o. 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 Pt states someone had mopped the flood but did not put a wet floor sign up. Pt does not want to file worker's comp.  Pt is a Production designer, theatre/television/filmmanager at [employer].  She applied ice last night. No pain medication today. Pt concerned about limited ROM of her elbow.  Denies hitting her head or LOC.  Pt does not recall how her arm landed in the fall as it occurred so fast.  Denies wrist or hand pain. No numbness in hand.  No prior hx of fracture or Right arm pain.  She is Right hand dominant.    Past Medical History:  Diagnosis Date  . Anxiety   . Depression   . Hyperkeratosis of cervix 01/18/2018    Patient Active Problem List   Diagnosis Date Noted  . Hyperkeratosis of cervix 01/18/2018  . Mood disorder (HCC) 01/07/2018  . History of substance use disorder 01/07/2018  . Other specified anxiety disorders 12/24/2017  . Dysthymia 12/24/2017  . Nicotine dependence 12/24/2017  . S/P tendon repair 05/07/2015  . Scar adherent 05/07/2015    Past Surgical History:  Procedure Laterality Date  . CHOLECYSTECTOMY      OB History    Gravida  1   Para  1   Term      Preterm      AB      Living        SAB      TAB      Ectopic      Multiple      Live Births               Home Medications    Prior to Admission medications   Not on File    Family History Family History  Problem Relation Age of Onset  . Anxiety disorder Mother   . Depression Father   . Anxiety disorder Sister   . Hypertension Other   . Heart attack Other     Social  History Social History   Tobacco Use  . Smoking status: Former Games developermoker  . Smokeless tobacco: Never Used  Substance Use Topics  . Alcohol use: Not Currently  . Drug use: Not Currently    Types: Marijuana     Allergies   Patient has no known allergies.   Review of Systems Review of Systems  Musculoskeletal: Positive for arthralgias, back pain (mild), joint swelling and myalgias. Negative for neck pain and neck stiffness.  Skin: Positive for color change. Negative for wound.  Neurological: Negative for dizziness, light-headedness and headaches.     Physical Exam Triage Vital Signs ED Triage Vitals [01/09/19 1424]  Enc Vitals Group     BP 122/78     Pulse Rate 81     Resp      Temp      Temp src      SpO2 97 %     Weight 152 lb 4 oz (69.1 kg)     Height  5\' 4"  (1.626 m)     Head Circumference      Peak Flow      Pain Score 7     Pain Loc      Pain Edu?      Excl. in GC?    No data found.  Updated Vital Signs BP 122/78 (BP Location: Left Arm)   Pulse 81   Ht 5\' 4"  (1.626 m)   Wt 152 lb 4 oz (69.1 kg)   LMP 01/02/2019   SpO2 97%   BMI 26.13 kg/m   Visual Acuity Right Eye Distance:   Left Eye Distance:   Bilateral Distance:    Right Eye Near:   Left Eye Near:    Bilateral Near:     Physical Exam Vitals signs and nursing note reviewed.  Constitutional:      Appearance: She is well-developed.  HENT:     Head: Normocephalic and atraumatic.  Neck:     Musculoskeletal: Normal range of motion and neck supple. No neck rigidity or muscular tenderness.  Cardiovascular:     Rate and Rhythm: Normal rate.     Pulses:          Radial pulses are 2+ on the right side.  Pulmonary:     Effort: Pulmonary effort is normal.  Musculoskeletal:        General: Swelling and tenderness present.     Comments: Right shoulder: Mild tenderness to posterior and anterior shoulder. No deformity or crepitus. Right elbow: diffuse tenderness, limited flexion and extension due to  pain. Right wrist and hand: non-tender, full ROM. 5/5 grip strength.   Skin:    General: Skin is warm and dry.     Capillary Refill: Capillary refill takes less than 2 seconds.     Comments: Right elbow: faint ecchymosis to lateral epicondyle. Skin in tact  Neurological:     Mental Status: She is alert and oriented to person, place, and time.  Psychiatric:        Behavior: Behavior normal.      UC Treatments / Results  Labs (all labs ordered are listed, but only abnormal results are displayed) Labs Reviewed - No data to display  EKG   Radiology Dg Shoulder Right  Result Date: 01/09/2019 CLINICAL DATA:  Fall last night. Right shoulder pain and decreased range of motion. Initial encounter. EXAM: RIGHT SHOULDER - 2+ VIEW COMPARISON:  None. FINDINGS: There is no evidence of fracture or dislocation. There is no evidence of arthropathy or other focal bone abnormality. Soft tissues are unremarkable. IMPRESSION: Negative. Electronically Signed   By: Danae OrleansJohn A Stahl M.D.   On: 01/09/2019 15:11   Dg Elbow Complete Right (3+view)  Result Date: 01/09/2019 CLINICAL DATA:  Fall last night. Right elbow pain. Initial encounter. EXAM: RIGHT ELBOW - COMPLETE 3+ VIEW COMPARISON:  None. FINDINGS: A large elbow joint effusion is seen. A nondisplaced fracture is seen involving radial head. No other fractures or bone lesions identified. IMPRESSION: Nondisplaced radial head fracture.  Large elbow joint effusion. Electronically Signed   By: Danae OrleansJohn A Stahl M.D.   On: 01/09/2019 15:13    Procedures Splint Application  Date/Time: 01/09/2019 3:58 PM Performed by: Lurene ShadowPhelps, Ilham Roughton O, PA-C Authorized by: Lurene ShadowPhelps, Marvia Troost O, PA-C   Consent:    Consent obtained:  Verbal   Consent given by:  Patient   Risks discussed:  Discoloration, numbness, pain and swelling   Alternatives discussed:  No treatment and delayed treatment Pre-procedure details:    Sensation:  Normal   Skin color:  Warm, dry, pink. elbow is bruised.  Procedure details:    Laterality:  Right   Location:  Elbow   Elbow:  R elbow   Strapping: no     Splint type:  Long arm (posterior)   Supplies:  Elastic bandage, cotton padding and Ortho-Glass Post-procedure details:    Pain:  Unchanged   Sensation:  Normal   Skin color:  Warm dry and pink hand   Patient tolerance of procedure:  Tolerated well, no immediate complications   (including critical care time)  Medications Ordered in UC Medications - No data to display  Initial Impression / Assessment and Plan / UC Course  I have reviewed the triage vital signs and the nursing notes.  Pertinent labs & imaging results that were available during my care of the patient were reviewed by me and considered in my medical decision making (see chart for details).     Discussed imagine with pt. Pt placed in splint as noted above Sling provided. AVS provided.  Final Clinical Impressions(s) / UC Diagnoses   Final diagnoses:  Elbow injury, right, initial encounter  Closed nondisplaced fracture of head of right radius, initial encounter  Fall from slip, trip, or stumble, initial encounter     Discharge Instructions      You may take Tylenol and Motrin as needed for pain and swelling. You may also apply a cool compress to your splint, just be sure not to get the splint wet.  Please call on Monday to schedule an appointment with Sports Medicine later this week for further evaluation and treatment of your elbow fracture.     ED Prescriptions    None     Controlled Substance Prescriptions [place] Controlled Substance Registry consulted? Not Applicable   Tyrell Antonio 01/09/19 1559

## 2019-01-09 NOTE — Discharge Instructions (Signed)
°  You may take Tylenol and Motrin as needed for pain and swelling. You may also apply a cool compress to your splint, just be sure not to get the splint wet.  Please call on Monday to schedule an appointment with Sports Medicine later this week for further evaluation and treatment of your elbow fracture.

## 2019-01-13 ENCOUNTER — Other Ambulatory Visit: Payer: Self-pay

## 2019-01-13 ENCOUNTER — Ambulatory Visit (INDEPENDENT_AMBULATORY_CARE_PROVIDER_SITE_OTHER): Payer: BC Managed Care – PPO | Admitting: Family Medicine

## 2019-01-13 ENCOUNTER — Encounter: Payer: Self-pay | Admitting: Family Medicine

## 2019-01-13 VITALS — BP 114/76 | HR 74 | Temp 97.6°F | Wt 160.0 lb

## 2019-01-13 DIAGNOSIS — S52124A Nondisplaced fracture of head of right radius, initial encounter for closed fracture: Secondary | ICD-10-CM | POA: Diagnosis not present

## 2019-01-13 NOTE — Progress Notes (Signed)
Paige Carter is a 33 y.o. female who presents to [facility] today for right radial head fracture.  Patient slipped and fell on July 24 seen in urgent care on July 25.  At that time x-ray showed a nondisplaced right radial head fracture and an elbow effusion.  She was treated with posterior splint and sling and advised to follow-up with me today.  In the interim she notes that she is doing quite well.  She notes significantly reduced pain.  She is been able to wean out of the splint already and feels more comfortable in the sling than she does in the splint.  She works as a Freight forwarder at [employer] and notes that she tried to not use the sling or the splint last night at work and had a little bit more swelling but overall is very happy with how things are going.  She denies any radiating pain weakness or numbness distally.  ROS:  As above  Exam:  BP 114/76   Pulse 74   Temp 97.6 F (36.4 C) (Oral)   Wt 160 lb (72.6 kg)   LMP 01/02/2019   BMI 27.46 kg/m  Wt Readings from Last 5 Encounters:  01/13/19 160 lb (72.6 kg)  01/09/19 152 lb 4 oz (69.1 kg)  02/12/18 145 lb (65.8 kg)  01/14/18 145 lb (65.8 kg)  01/07/18 142 lb (64.4 kg)   General: Well Developed, well nourished, and in no acute distress.  Neuro/Psych: Alert and oriented x3, extra-ocular muscles intact, able to move all 4 extremities, sensation grossly intact. Skin: Warm and dry, no rashes noted.  Respiratory: Not using accessory muscles, speaking in full sentences, trachea midline.  Cardiovascular: Pulses palpable, no extremity edema. Abdomen: Does not appear distended. MSK: Right elbow relatively normal-appearing with slight bruising and slight swelling but otherwise no significant deformity. Range of motion Extension limited by about 20 degrees.  Pronation limited by about 20 degrees.  Lateral elbow not particularly tender.  Pulses cap refill and sensation are intact distally.     Lab and Radiology Results No results found for this or any previous visit (from the past 72 hour(s)). Dg Shoulder Right  Result Date: 01/09/2019 CLINICAL DATA:  Fall last night. Right shoulder pain and decreased range of motion. Initial encounter. EXAM: RIGHT SHOULDER - 2+ VIEW COMPARISON:  None. FINDINGS: There is no evidence of fracture or dislocation. There is no evidence of arthropathy or other focal bone abnormality. Soft tissues are unremarkable. IMPRESSION: Negative. Electronically Signed   By: Marlaine Hind M.D.   On: 01/09/2019 15:11   Dg Elbow Complete Right (3+view)  Result Date: 01/09/2019 CLINICAL DATA:  Fall last night. Right elbow pain. Initial encounter. EXAM: RIGHT ELBOW - COMPLETE 3+ VIEW COMPARISON:  None. FINDINGS: A large elbow joint effusion is seen. A nondisplaced fracture is seen involving radial head. No other fractures or bone lesions identified. IMPRESSION: Nondisplaced radial head fracture.  Large elbow joint effusion. Electronically Signed   By: Marlaine Hind M.D.   On: 01/09/2019 15:13   I personally (independently) visualized and performed the interpretation of the images attached in this note.     Assessment and Plan: 33 y.o. female with right elbow radial head fracture.  Nondisplaced on x-ray a few days ago.  Clinically doing extremely well.  Okay to transition to sling now.  Use splint as needed if worsening pain.  Stressed the importance of reduced activity with her right arm.  Recheck in  1 week with fall as well will start liberalize activity.  We will get x-rays at next visit.   PDMP not reviewed this encounter. No orders of the defined types were placed in this encounter.  No orders of the defined types were placed in this encounter.   Historical information moved to improve visibility of documentation.  Past Medical History:  Diagnosis Date  . Anxiety   . Depression   . Hyperkeratosis of cervix 01/18/2018   Past Surgical History:  Procedure  Laterality Date  . CHOLECYSTECTOMY     Social History   Tobacco Use  . Smoking status: Former Games developermoker  . Smokeless tobacco: Never Used  Substance Use Topics  . Alcohol use: Not Currently   family history includes Anxiety disorder in her mother and sister; Depression in her father; Heart attack in an other family member; Hypertension in an other family member.  Medications: No current outpatient medications on file.   No current facility-administered medications for this visit.    No Known Allergies    Discussed warning signs or symptoms. Please see discharge instructions. Patient expresses understanding.

## 2019-01-13 NOTE — Patient Instructions (Signed)
Thank you for coming in today. Check back in 1 week  Ok to transition to just sling as guided by pain.  Use the splint if needed.  OK to use otc medicines for pain as needed.  Limit activity as you can.  We will do xray at next check.    Radial Head Fracture  A radial head fracture is a break in the smaller bone in your forearm (radius) near the end of the bone at the elbow joint. There are two bones in your forearm. The radius, or radial bone, is the bone on the side of your thumb. There are different types of radial head fractures. The type is determined by the amount of movement (displacement) of bones from their normal positions:  Type 1. This is a small fracture in which the bone pieces remain together (nondisplaced fracture).  Type 2. The fracture is moderate, and bone pieces are slightly displaced.  Type 3. There are multiple fractures and displaced bone pieces. What are the causes? This condition is usually caused by falling and landing on an outstretched arm. What increases the risk? You are more likely to develop this condition if you:  Are female.  Are 77-56 years old.  Have weak bones or osteoporosis. What are the signs or symptoms? Symptoms of this condition include:  Swelling of the elbow joint.  Pain and difficulty when moving the elbow joint. How is this diagnosed? This condition is diagnosed based on your medical history and a physical exam. You may have X-rays to confirm the type of fracture. How is this treated? Treatment for this condition includes resting, icing, and raising (elevating) the injured area above the level of your heart. You may be given medicines to help relieve pain. Treatment varies depending on the type of fracture you have.  For a type 1 fracture, you may be given a splint or sling to keep your arm and elbow from moving for several days.  For a type 2 fracture, you may be given a splint or sling to keep your arm and elbow from moving  for several days. If the displacement is more severe, you may need surgery.  For a type 3 fracture, you will usually need surgery to have bone pieces removed. The entire radial head may need to be removed if the damage is severe. Follow these instructions at home: Medicines  Take over-the-counter and prescription medicines only as told by your health care provider.  Ask your health care provider if the medicine prescribed to you: ? Requires you to avoid driving or using heavy machinery. ? Can cause constipation. You may need to take these actions to prevent or treat constipation:  Drink enough fluid to keep your urine pale yellow.  Take over-the-counter or prescription medicines.  Eat foods that are high in fiber, such as beans, whole grains, and fresh fruits and vegetables.  Limit foods that are high in fat and processed sugars, such as fried or sweet foods. If you have a splint or sling:  Wear the splint or sling as told by your health care provider. Remove it only as told by your health care provider.  Loosen it if your fingers tingle, become numb, or turn cold and blue.  Keep it clean and dry. If you have a splint:  Do not put pressure on any part of the splint until it is fully hardened. This may take several hours.  Check the skin around it every day. Tell your health care provider about  any concerns. Bathing  Do not take baths, swim, or use a hot tub until your health care provider approves. Ask your health care provider if you may take showers. You may only be allowed to take sponge baths.  If the splint or sling is not waterproof: ? Do not let it get wet. ? Cover it with a watertight covering when you take a bath or shower. Managing pain, stiffness, and swelling   If directed, put ice on the injured area. ? If you have a removable splint or sling, remove it as told by your health care provider. ? Put ice in a plastic bag. ? Place a towel between your skin and the  bag. ? Leave the ice on for 20 minutes, 2-3 times a day.  Move your fingers often to reduce stiffness and swelling.  Raise (elevate) the injured area above the level of your heart while you are sitting or lying down. Activity  Ask your health care provider when it is safe to drive if you have a splint or sling on your arm.  Do not lift anything that is heavier than 10 lb (4.5 kg), or the limit that you are told, until your health care provider says that it is safe.  Return to your normal activities as told by your health care provider. Ask your health care provider what activities are safe for you.  Do exercises as told by your health care provider or physical therapist. General instructions  Do not use any products that contain nicotine or tobacco, such as cigarettes, e-cigarettes, and chewing tobacco. These can delay bone healing. If you need help quitting, ask your health care provider.  Keep all follow-up visits as told by your health care provider. This is important. Contact a health care provider if:  You have problems with your splint.  You have pain or swelling that gets worse. Get help right away if:  You have severe pain.  You have fluid or a bad smell coming from your splint.  Your hand or fingers get cold or turn pale or blue.  You lose feeling in any part of your hand or arm. Summary  A radial head fracture is a break in the smaller bone in your forearm (radius) near the end of the bone at the elbow joint.  This condition usually happens because of an injury, such as falling on an outstretched arm.  This condition may be treated with rest, ice, elevation, pain medicines, a splint or sling, and surgery. Treatment will vary depending on the type of fracture you have. This information is not intended to replace advice given to you by your health care provider. Make sure you discuss any questions you have with your health care provider. Document Released: 03/25/2006  Document Revised: 06/11/2018 Document Reviewed: 06/11/2018 Elsevier Patient Education  2020 [employer]

## 2019-01-20 ENCOUNTER — Ambulatory Visit (INDEPENDENT_AMBULATORY_CARE_PROVIDER_SITE_OTHER): Payer: BC Managed Care – PPO | Admitting: Family Medicine

## 2019-01-20 ENCOUNTER — Encounter: Payer: Self-pay | Admitting: Family Medicine

## 2019-01-20 ENCOUNTER — Ambulatory Visit (INDEPENDENT_AMBULATORY_CARE_PROVIDER_SITE_OTHER): Payer: BC Managed Care – PPO

## 2019-01-20 ENCOUNTER — Other Ambulatory Visit: Payer: Self-pay

## 2019-01-20 VITALS — BP 121/77 | HR 55 | Temp 97.9°F | Wt 156.0 lb

## 2019-01-20 DIAGNOSIS — S52124A Nondisplaced fracture of head of right radius, initial encounter for closed fracture: Secondary | ICD-10-CM

## 2019-01-20 DIAGNOSIS — M25531 Pain in right wrist: Secondary | ICD-10-CM

## 2019-01-20 NOTE — Progress Notes (Signed)
Paige Carter is a 33 y.o. female who presents to [facility] today for right radial head fracture follow-up.  Patient was seen a week ago for right radial head fracture.  She had been seen in urgent care and diagnosed on July 25.  In the interim she notes that she has developed some wrist pain.  She cannot recall any wrist pain immediately after the accident but has developed it worse over the last few days.  She notes pain is primarily located in the radial wrist.  She also has some mild elbow pain as well at the radial head where she has her fracture.   ROS:  As above  Exam:  BP 121/77   Pulse (!) 55   Temp 97.9 F (36.6 C) (Oral)   Wt 156 lb (70.8 kg)   LMP 01/02/2019   BMI 26.78 kg/m  Wt Readings from Last 5 Encounters:  01/20/19 156 lb (70.8 kg)  01/13/19 160 lb (72.6 kg)  01/09/19 152 lb 4 oz (69.1 kg)  02/12/18 145 lb (65.8 kg)  01/14/18 145 lb (65.8 kg)   General: Well Developed, well nourished, and in no acute distress.  Neuro/Psych: Alert and oriented x3, extra-ocular muscles intact, able to move all 4 extremities, sensation grossly intact. Skin: Warm and dry, no rashes noted.  Respiratory: Not using accessory muscles, speaking in full sentences, trachea midline.  Cardiovascular: Pulses palpable, no extremity edema. Abdomen: Does not appear distended. MSK: Right elbow normal-appearing not particularly tender.  Slight decreased range of motion lacking full extension by about 5 degrees and lacking full supination by about 5 degrees otherwise normal. Right wrist normal-appearing not particular tender.  Normal motion.  Negative Finkelstein's test.    Lab and Radiology Results X-ray images right elbow and right wrist obtained today personally independently reviewed  Right elbow: Continued subtle fracture radial head.  No displacement.  Fracture healing is present.  Reduced effusion compared to previous x-ray.  Right  wrist: No obvious fracture per my read today.  No severe degenerative changes.  Await formal radiology over read.    Assessment and Plan: 33 y.o. female with right elbow radial head fracture.  Doing quite well.  Continue sling especially with activity.  Recheck in about 2 weeks.  Right wrist pain: Some pain near the radial wrist and anatomical snuffbox.  X-ray does not show obvious fracture per my read however radiology overread is still pending.  It is likely due to wrist overuse with sling use.  However subtle scaphoid fracture could be present.  Plan for thumb spica splint and recheck in 2 weeks.   PDMP not reviewed this encounter. Orders Placed This Encounter  Procedures  . DG Elbow Complete Right    Standing Status:   Future    Number of Occurrences:   1    Standing Expiration Date:   03/21/2020    Order Specific Question:   Reason for Exam (SYMPTOM  OR DIAGNOSIS REQUIRED)    Answer:   follow up fracute    Order Specific Question:   Is patient pregnant?    Answer:   No    Order Specific Question:   Preferred imaging location?    Answer:   [facility]    Order Specific Question:   Radiology Contrast Protocol - do NOT remove file path    Answer:   \\[email or web address]  . DG Wrist Complete Right    Standing Status:   Future    Number  of Occurrences:   1    Standing Expiration Date:   03/21/2020    Order Specific Question:   Reason for Exam (SYMPTOM  OR DIAGNOSIS REQUIRED)    Answer:   radial wrist pain. Fell radial head fx 1 week ago    Order Specific Question:   Is patient pregnant?    Answer:   No    Order Specific Question:   Preferred imaging location?    [facility]    Order Specific Question:   Radiology Contrast Protocol - do NOT remove file path    Answer:   \\[email or web address]s.pdf   No orders of the defined types were placed in this encounter.   Historical  information moved to improve visibility of documentation.  Past Medical History:  Diagnosis Date  . Anxiety   . Depression   . Hyperkeratosis of cervix 01/18/2018   Past Surgical History:  Procedure Laterality Date  . CHOLECYSTECTOMY     Social History   Tobacco Use  . Smoking status: Former Games developermoker  . Smokeless tobacco: Never Used  Substance Use Topics  . Alcohol use: Not Currently   family history includes Anxiety disorder in her mother and sister; Depression in her father; Heart attack in an other family member; Hypertension in an other family member.  Medications: No current outpatient medications on file.   No current facility-administered medications for this visit.    No Known Allergies    Discussed warning signs or symptoms. Please see discharge instructions. Patient expresses understanding.

## 2019-01-20 NOTE — Patient Instructions (Signed)
Thank you for coming in today. We will use a brace/splint for the wrist.  Recheck in person in about 2 weeks.  If all is well we will be done.

## 2019-02-03 ENCOUNTER — Ambulatory Visit: Payer: BC Managed Care – PPO | Admitting: Family Medicine

## 2019-02-03 NOTE — Progress Notes (Deleted)
   Paige Carter is a 33 y.o. female who presents to [facility] today for right radial head fracture follow-up.  Patient fell and was seen in urgent care on 25 July for nondisplaced radial head fracture.  She followed back up with me most recently on August 5 after some immobilization.  At that time her elbow was feeling a lot better was only a little bit sore.  She also had some wrist pain as well and proceeded with sling for elbow and although x-ray did not show any fracture proceeded with thumb spica splint.  On recheck today she notes that ***    ROS:  As above  Exam:  There were no vitals taken for this visit. Wt Readings from Last 5 Encounters:  01/20/19 156 lb (70.8 kg)  01/13/19 160 lb (72.6 kg)  01/09/19 152 lb 4 oz (69.1 kg)  02/12/18 145 lb (65.8 kg)  01/14/18 145 lb (65.8 kg)   General: Well Developed, well nourished, and in no acute distress.  Neuro/Psych: Alert and oriented x3, extra-ocular muscles intact, able to move all 4 extremities, sensation grossly intact. Skin: Warm and dry, no rashes noted.  Respiratory: Not using accessory muscles, speaking in full sentences, trachea midline.  Cardiovascular: Pulses palpable, no extremity edema. Abdomen: Does not appear distended. MSK: ***    Lab and Radiology Results No results found for this or any previous visit (from the past 72 hour(s)). No results found.     Assessment and Plan: 33 y.o. female with ***   PDMP not reviewed this encounter. No orders of the defined types were placed in this encounter.  No orders of the defined types were placed in this encounter.   Historical information moved to improve visibility of documentation.  Past Medical History:  Diagnosis Date  . Anxiety   . Depression   . Hyperkeratosis of cervix 01/18/2018   Past Surgical History:  Procedure Laterality Date  . CHOLECYSTECTOMY     Social History   Tobacco Use  . Smoking  status: Former Research scientist (life sciences)  . Smokeless tobacco: Never Used  Substance Use Topics  . Alcohol use: Not Currently   family history includes Anxiety disorder in her mother and sister; Depression in her father; Heart attack in an other family member; Hypertension in an other family member.  Medications: No current outpatient medications on file.   No current facility-administered medications for this visit.    No Known Allergies    Discussed warning signs or symptoms. Please see discharge instructions. Patient expresses understanding.

## 2019-05-01 ENCOUNTER — Other Ambulatory Visit: Payer: Self-pay

## 2019-05-01 ENCOUNTER — Emergency Department (INDEPENDENT_AMBULATORY_CARE_PROVIDER_SITE_OTHER)
Admission: EM | Admit: 2019-05-01 | Discharge: 2019-05-01 | Disposition: A | Discharge status: Home / Self Care | Payer: BC Managed Care – PPO | Source: Home / Self Care

## 2019-05-01 ENCOUNTER — Encounter: Payer: Self-pay | Admitting: Emergency Medicine

## 2019-05-01 DIAGNOSIS — R05 Cough: Secondary | ICD-10-CM

## 2019-05-01 DIAGNOSIS — Z20828 Contact with and (suspected) exposure to other viral communicable diseases: Secondary | ICD-10-CM

## 2019-05-01 DIAGNOSIS — R059 Cough, unspecified: Secondary | ICD-10-CM

## 2019-05-01 DIAGNOSIS — Z20822 Contact with and (suspected) exposure to covid-19: Secondary | ICD-10-CM

## 2019-05-01 LAB — POC SARS CORONAVIRUS 2 AG -  ED: SARS Coronavirus 2 Ag: NEGATIVE

## 2019-05-01 NOTE — ED Triage Notes (Signed)
Here for COVID testing after mom tested positive on Thursday. Sx's started 3 days ago- slight cough, fatigue.

## 2019-05-01 NOTE — Discharge Instructions (Signed)
  You may take 500mg acetaminophen every 4-6 hours or in combination with ibuprofen 400-600mg every 6-8 hours as needed for pain, inflammation, and fever.  Be sure to well hydrated with clear liquids and get at least 8 hours of sleep at night, preferably more while sick.   Please follow up with family medicine in 1 week if needed.   

## 2019-05-01 NOTE — ED Provider Notes (Signed)
[facility]    CSN: [id number] Arrival date & time: 05/01/19  1104      History   Chief Complaint Chief Complaint  Patient presents with  . Cough    positive covid exposure 6 dys ago    HPI Claudean D Cato is a 33 y.o. female.   HPI Paige Carter is a 33 y.o. female presenting to [facility] with c/o 3 days of mild cough and fatigue. She reports her mother tested positive for Covid 2 days ago.  Pt was last around her mother about 1 week ago. Pt thinks her cough is due to allergies, but because she had close exposure to her mother last week, she wants to make sure she does not have Covid too. Denies fever, chills, n/v/d. No chest pain or SOB. She takes benadryl as needed for her allergies and cough.   Past Medical History:  Diagnosis Date  . Anxiety   . Depression   . Hyperkeratosis of cervix 01/18/2018    Patient Active Problem List   Diagnosis Date Noted  . Hyperkeratosis of cervix 01/18/2018  . Mood disorder (HCC) 01/07/2018  . History of substance use disorder 01/07/2018  . Other specified anxiety disorders 12/24/2017  . Dysthymia 12/24/2017  . Nicotine dependence 12/24/2017  . S/P tendon repair 05/07/2015  . Scar adherent 05/07/2015    Past Surgical History:  Procedure Laterality Date  . CHOLECYSTECTOMY      OB History    Gravida  1   Para  1   Term      Preterm      AB      Living        SAB      TAB      Ectopic      Multiple      Live Births               Home Medications    Prior to Admission medications   Not on File    Family History Family History  Problem Relation Age of Onset  . Anxiety disorder Mother   . Depression Father   . Anxiety disorder Sister   . Hypertension Other   . Heart attack Other     Social History Social History   Tobacco Use  . Smoking status: Former Games developer  . Smokeless tobacco: Never Used  Substance Use Topics  . Alcohol use: Not Currently  . Drug use: Not Currently    Types:  Marijuana     Allergies   Patient has no known allergies.   Review of Systems Review of Systems  Constitutional: Negative for chills and fever.  HENT: Positive for congestion (minimal). Negative for ear pain, sore throat, trouble swallowing and voice change.   Respiratory: Positive for cough. Negative for shortness of breath.   Cardiovascular: Negative for chest pain and palpitations.  Gastrointestinal: Negative for abdominal pain, diarrhea, nausea and vomiting.  Musculoskeletal: Negative for arthralgias, back pain and myalgias.  Skin: Negative for rash.     Physical Exam Triage Vital Signs ED Triage Vitals  Enc Vitals Group     BP 05/01/19 1152 125/81     Pulse Rate 05/01/19 1152 (!) 103     Resp --      Temp 05/01/19 1152 97.7 F (36.5 C)     Temp Source 05/01/19 1152 Oral     SpO2 05/01/19 1152 97 %     Weight 05/01/19 1153 160 lb (72.6 kg)  Height 05/01/19 1153 5\' 3"  (1.6 m)     Head Circumference --      Peak Flow --      Pain Score 05/01/19 1153 0     Pain Loc --      Pain Edu? --      Excl. in [place]? --    No data found.  Updated Vital Signs BP 125/81 (BP Location: Left Arm)   Pulse (!) 103   Temp 97.7 F (36.5 C) (Oral)   Ht 5\' 3"  (1.6 m)   Wt 160 lb (72.6 kg)   SpO2 97%   BMI 28.34 kg/m   Visual Acuity Right Eye Distance:   Left Eye Distance:   Bilateral Distance:    Right Eye Near:   Left Eye Near:    Bilateral Near:     Physical Exam Vitals signs and nursing note reviewed.  Constitutional:      General: She is not in acute distress.    Appearance: Normal appearance. She is well-developed. She is not ill-appearing, toxic-appearing or diaphoretic.  HENT:     Head: Normocephalic and atraumatic.     Right Ear: Tympanic membrane and ear canal normal.     Left Ear: Tympanic membrane and ear canal normal.     Nose: Nose normal.     Mouth/Throat:     Mouth: Mucous membranes are moist.     Pharynx: Oropharynx is clear.  Neck:      Musculoskeletal: Normal range of motion.  Cardiovascular:     Rate and Rhythm: Normal rate and regular rhythm.  Pulmonary:     Effort: Pulmonary effort is normal. No respiratory distress.     Breath sounds: Normal breath sounds. No stridor. No wheezing, rhonchi or rales.  Musculoskeletal: Normal range of motion.  Skin:    General: Skin is warm and dry.  Neurological:     Mental Status: She is alert and oriented to person, place, and time.  Psychiatric:        Behavior: Behavior normal.      UC Treatments / Results  Labs (all labs ordered are listed, but only abnormal results are displayed) Labs Reviewed  SARS-COV-2 RNA,(COVID-19) QUALITATIVE NAAT  POC SARS CORONAVIRUS 2 ED    EKG   Radiology No results found.  Procedures Procedures (including critical care time)  Medications Ordered in UC Medications - No data to display  Initial Impression / Assessment and Plan / UC Course  I have reviewed the triage vital signs and the nursing notes.  Pertinent labs & imaging results that were available during my care of the patient were reviewed by me and considered in my medical decision making (see chart for details).     Normal Exam Rapid Covid: NEGATIVE Covid send out pending AVS provided  Final Clinical Impressions(s) / UC Diagnoses   Final diagnoses:  Cough  Close exposure to COVID-19 virus     Discharge Instructions      You may take 500mg  acetaminophen every 4-6 hours or in combination with ibuprofen 400-600mg  every 6-8 hours as needed for pain, inflammation, and fever.  Be sure to well hydrated with clear liquids and get at least 8 hours of sleep at night, preferably more while sick.   Please follow up with family medicine in 1 week if needed.      ED Prescriptions    None     PDMP not reviewed this encounter.   Noe Gens, PA-C 05/01/19 1224

## 2019-05-03 LAB — SARS-COV-2 RNA,(COVID-19) QUALITATIVE NAAT: SARS CoV2 RNA: NOT DETECTED

## 2019-05-12 ENCOUNTER — Other Ambulatory Visit: Payer: Self-pay

## 2019-05-12 DIAGNOSIS — Z20822 Contact with and (suspected) exposure to covid-19: Secondary | ICD-10-CM

## 2019-05-13 LAB — NOVEL CORONAVIRUS, NAA: SARS-CoV-2, NAA: NOT DETECTED

## 2019-06-15 ENCOUNTER — Ambulatory Visit: Payer: BC Managed Care – PPO | Attending: Internal Medicine

## 2019-06-15 DIAGNOSIS — Z20822 Contact with and (suspected) exposure to covid-19: Secondary | ICD-10-CM

## 2019-06-16 LAB — NOVEL CORONAVIRUS, NAA: SARS-CoV-2, NAA: NOT DETECTED

## 2020-10-10 IMAGING — DX RIGHT SHOULDER - 2+ VIEW
3 series · 3 of 3 positions shown · non-contrast
Comparison: None.

CLINICAL DATA: Fall last night. Right shoulder pain and decreased
range of motion. Initial encounter.

EXAM:
RIGHT SHOULDER - 2+ VIEW

[shoulder grashey]
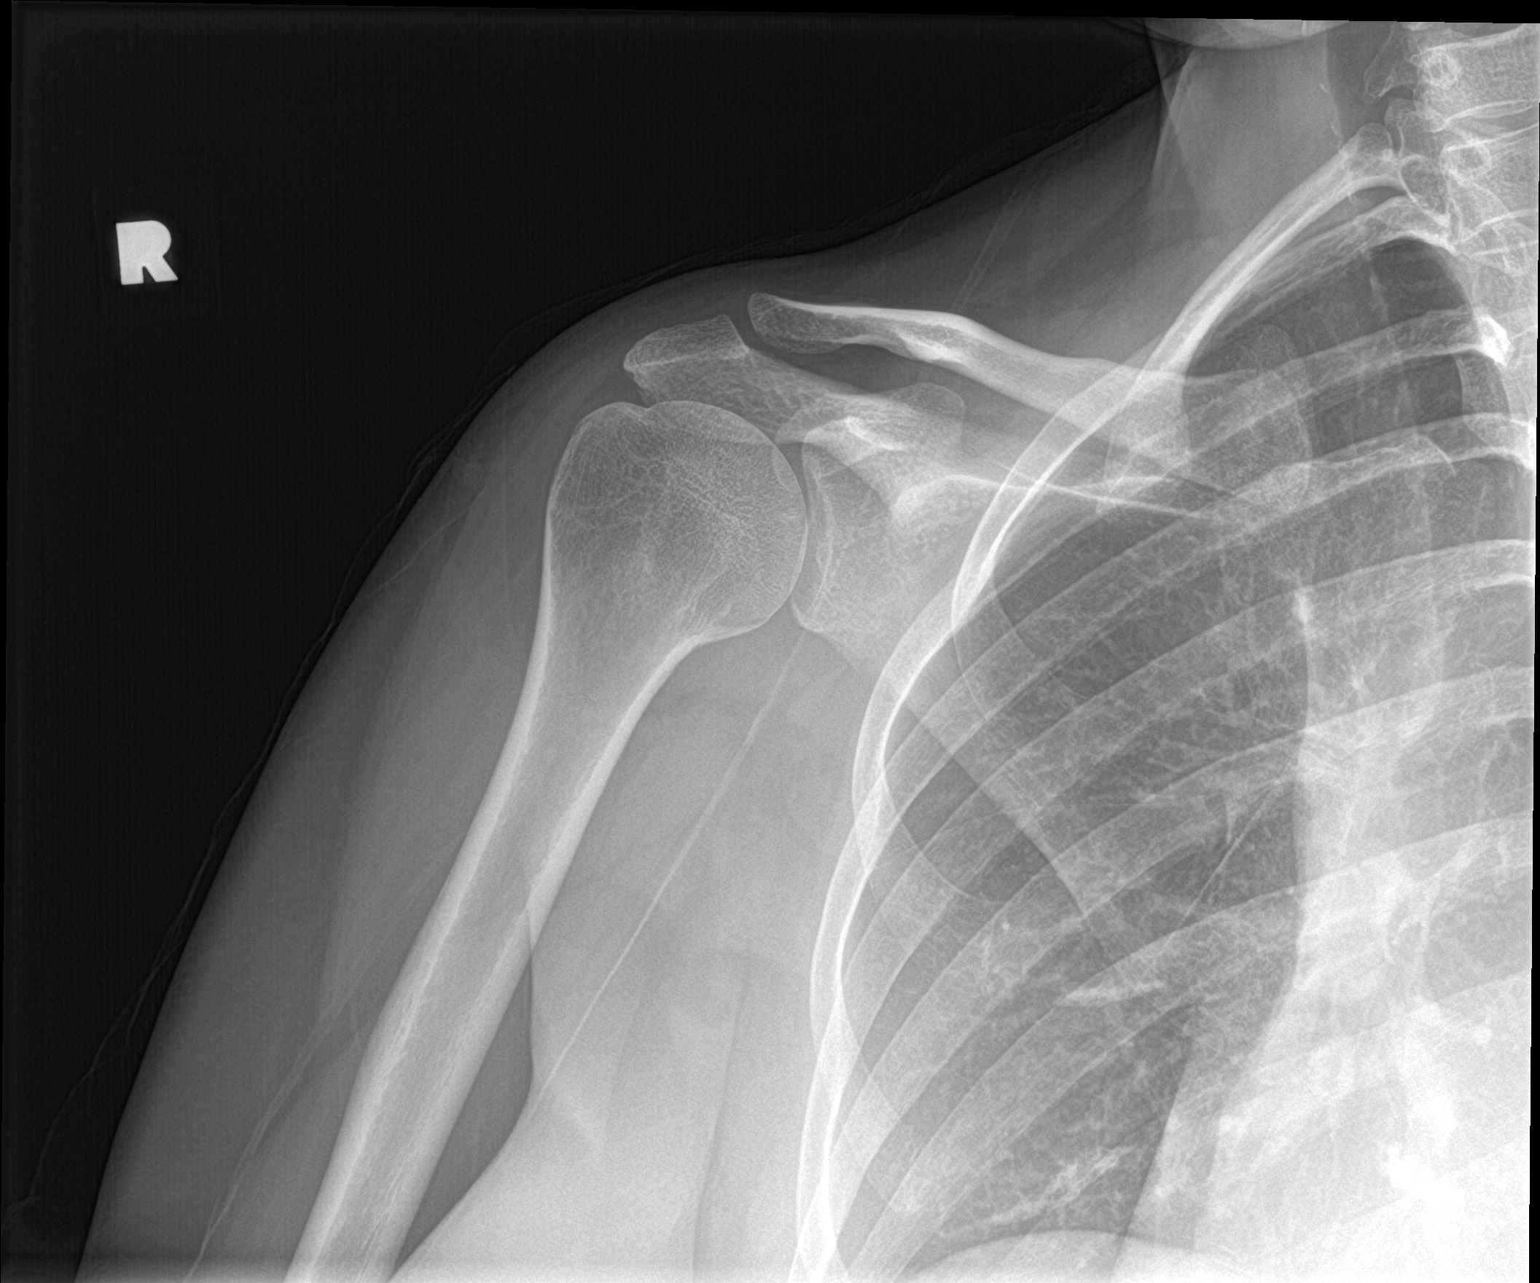

[shoulder y view]
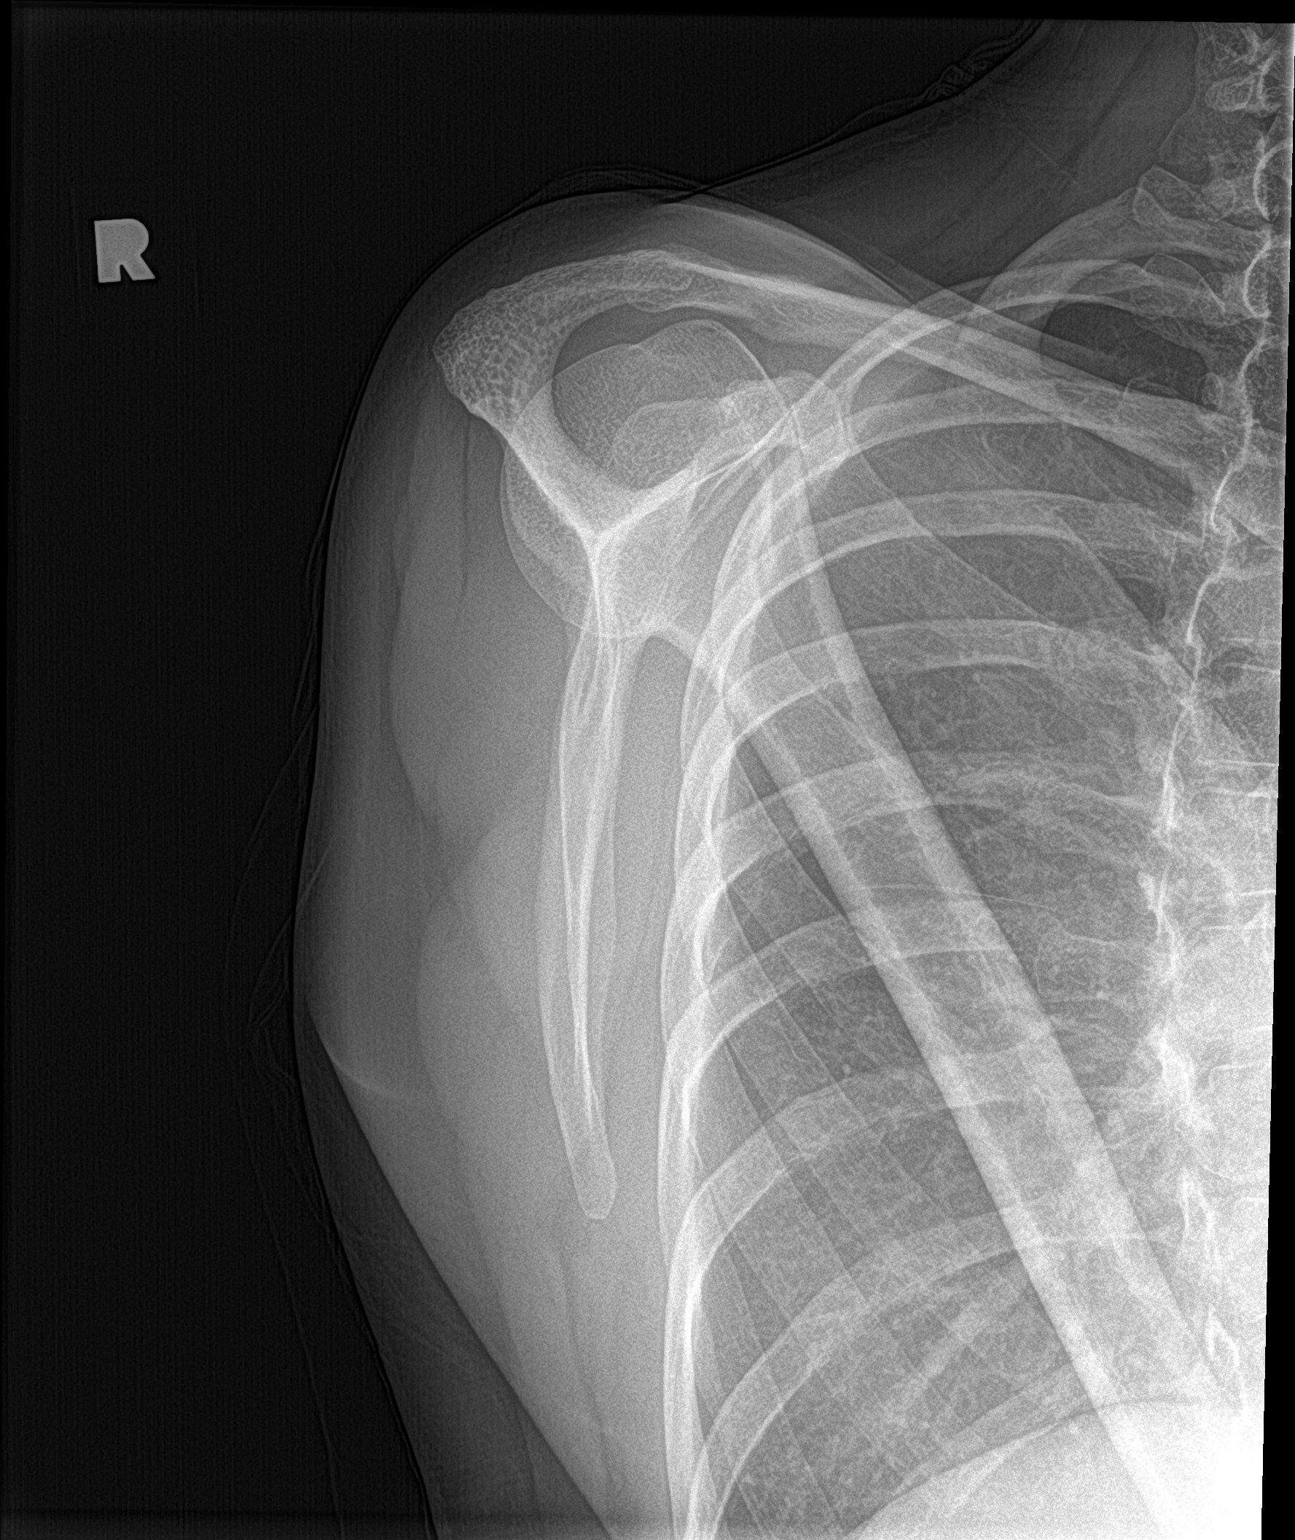

[shoulder axillary]
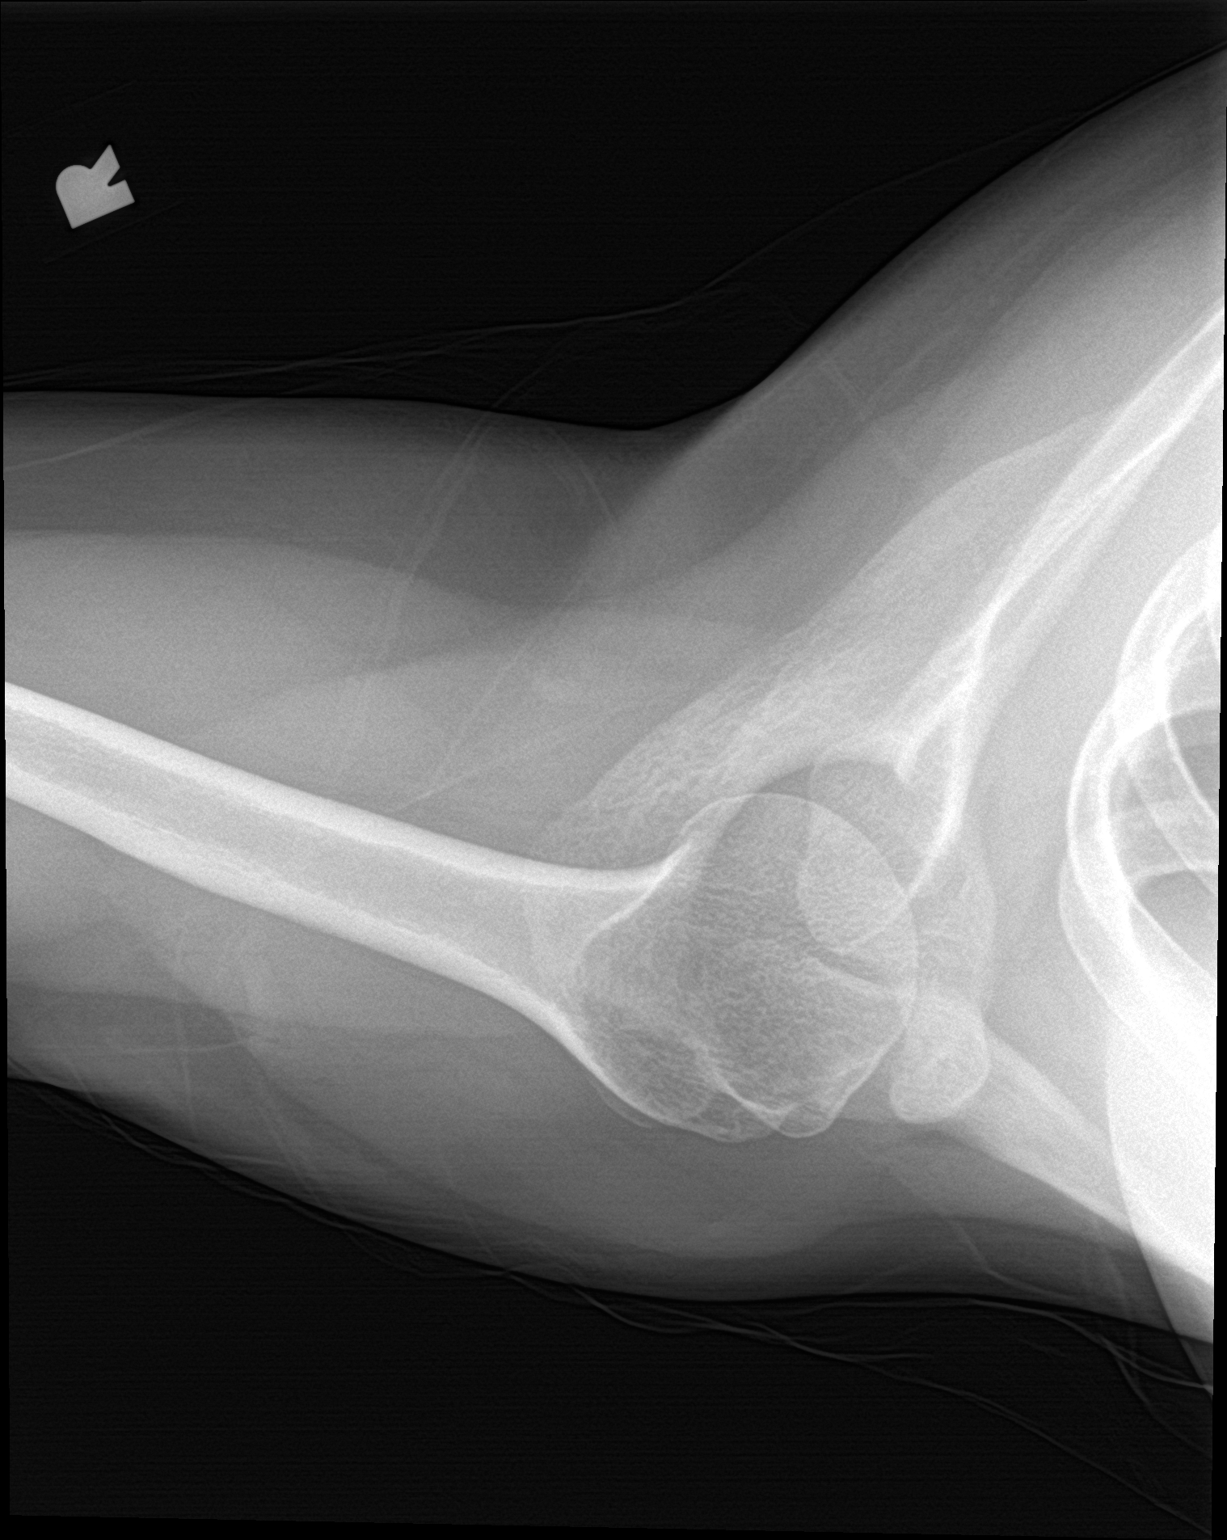

[3 of 3 positions shown; findings below may reference images not displayed]

FINDINGS: There is no evidence of fracture or dislocation. There is no
evidence of arthropathy or other focal bone abnormality. Soft
tissues are unremarkable.
IMPRESSION: Negative.
